# Patient Record
Sex: Female | Born: 1955
Health system: Southern US, Community
[De-identification: ages and names within clinical notes are randomized; demographics above are authoritative.]

## PROBLEM LIST (undated history)

## (undated) DIAGNOSIS — M545 Low back pain, unspecified: Secondary | ICD-10-CM

## (undated) DIAGNOSIS — M5136 Other intervertebral disc degeneration, lumbar region: Secondary | ICD-10-CM

## (undated) DIAGNOSIS — K219 Gastro-esophageal reflux disease without esophagitis: Secondary | ICD-10-CM

## (undated) DIAGNOSIS — G9519 Other vascular myelopathies: Secondary | ICD-10-CM

## (undated) DIAGNOSIS — M51369 Other intervertebral disc degeneration, lumbar region without mention of lumbar back pain or lower extremity pain: Secondary | ICD-10-CM

## (undated) DIAGNOSIS — M25511 Pain in right shoulder: Secondary | ICD-10-CM

## (undated) DIAGNOSIS — F32A Depression, unspecified: Secondary | ICD-10-CM

## (undated) DIAGNOSIS — M4180 Other forms of scoliosis, site unspecified: Secondary | ICD-10-CM

## (undated) DIAGNOSIS — M4800 Spinal stenosis, site unspecified: Secondary | ICD-10-CM

## (undated) DIAGNOSIS — M12811 Other specific arthropathies, not elsewhere classified, right shoulder: Secondary | ICD-10-CM

## (undated) DIAGNOSIS — F419 Anxiety disorder, unspecified: Secondary | ICD-10-CM

## (undated) DIAGNOSIS — M48062 Spinal stenosis, lumbar region with neurogenic claudication: Secondary | ICD-10-CM

## (undated) DIAGNOSIS — M47816 Spondylosis without myelopathy or radiculopathy, lumbar region: Secondary | ICD-10-CM

## (undated) DIAGNOSIS — I10 Essential (primary) hypertension: Secondary | ICD-10-CM

## (undated) DIAGNOSIS — F329 Major depressive disorder, single episode, unspecified: Secondary | ICD-10-CM

## (undated) DIAGNOSIS — M25552 Pain in left hip: Secondary | ICD-10-CM

## (undated) DIAGNOSIS — M5416 Radiculopathy, lumbar region: Secondary | ICD-10-CM

## (undated) DIAGNOSIS — M4150 Other secondary scoliosis, site unspecified: Secondary | ICD-10-CM

## (undated) DIAGNOSIS — R29818 Other symptoms and signs involving the nervous system: Secondary | ICD-10-CM

## (undated) HISTORY — DX: Spinal stenosis, site unspecified: M48.00

## (undated) HISTORY — DX: Other forms of scoliosis, site unspecified: M41.80

## (undated) HISTORY — PX: BACK SURGERY: SHX140

## (undated) HISTORY — DX: Low back pain, unspecified: M54.50

## (undated) HISTORY — DX: Radiculopathy, lumbar region: M54.16

## (undated) HISTORY — DX: Other symptoms and signs involving the nervous system: R29.818

## (undated) HISTORY — DX: Spondylosis without myelopathy or radiculopathy, lumbar region: M47.816

## (undated) HISTORY — DX: Other intervertebral disc degeneration, lumbar region: M51.36

## (undated) HISTORY — DX: Pain in right shoulder: M25.511

## (undated) HISTORY — DX: Pain in left hip: M25.552

## (undated) HISTORY — PX: TUBAL LIGATION: SHX77

## (undated) HISTORY — DX: Other secondary scoliosis, site unspecified: M41.50

## (undated) HISTORY — DX: Other vascular myelopathies: G95.19

## (undated) HISTORY — DX: Other intervertebral disc degeneration, lumbar region without mention of lumbar back pain or lower extremity pain: M51.369

## (undated) HISTORY — DX: Other specific arthropathies, not elsewhere classified, right shoulder: M12.811

## (undated) HISTORY — PX: SHOULDER SURGERY: SHX246

---
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
## 2000-12-11 ENCOUNTER — Emergency Department (HOSPITAL_COMMUNITY): Admission: EM | Admit: 2000-12-11 | Discharge: 2000-12-11 | Payer: Self-pay | Admitting: Emergency Medicine

## 2001-05-02 ENCOUNTER — Encounter: Payer: Self-pay | Admitting: Internal Medicine

## 2001-05-02 ENCOUNTER — Ambulatory Visit (HOSPITAL_COMMUNITY): Admission: RE | Admit: 2001-05-02 | Discharge: 2001-05-02 | Payer: Self-pay | Admitting: Internal Medicine

## 2001-05-16 ENCOUNTER — Ambulatory Visit (HOSPITAL_COMMUNITY): Admission: RE | Admit: 2001-05-16 | Discharge: 2001-05-16 | Payer: Self-pay | Admitting: Internal Medicine

## 2001-05-16 ENCOUNTER — Encounter: Payer: Self-pay | Admitting: Internal Medicine

## 2013-12-21 DIAGNOSIS — K219 Gastro-esophageal reflux disease without esophagitis: Secondary | ICD-10-CM | POA: Insufficient documentation

## 2014-02-01 DIAGNOSIS — M159 Polyosteoarthritis, unspecified: Secondary | ICD-10-CM | POA: Insufficient documentation

## 2014-03-01 DIAGNOSIS — I10 Essential (primary) hypertension: Secondary | ICD-10-CM | POA: Insufficient documentation

## 2015-03-24 DIAGNOSIS — Z0000 Encounter for general adult medical examination without abnormal findings: Secondary | ICD-10-CM | POA: Insufficient documentation

## 2015-05-04 HISTORY — PX: CARPAL TUNNEL RELEASE: SHX101

## 2015-07-24 DIAGNOSIS — M75121 Complete rotator cuff tear or rupture of right shoulder, not specified as traumatic: Secondary | ICD-10-CM | POA: Insufficient documentation

## 2015-08-14 DIAGNOSIS — D649 Anemia, unspecified: Secondary | ICD-10-CM | POA: Insufficient documentation

## 2015-11-03 DIAGNOSIS — K2270 Barrett's esophagus without dysplasia: Secondary | ICD-10-CM | POA: Insufficient documentation

## 2016-09-06 DIAGNOSIS — M5136 Other intervertebral disc degeneration, lumbar region: Secondary | ICD-10-CM | POA: Insufficient documentation

## 2016-09-28 DIAGNOSIS — M5416 Radiculopathy, lumbar region: Secondary | ICD-10-CM | POA: Insufficient documentation

## 2017-11-09 DIAGNOSIS — E559 Vitamin D deficiency, unspecified: Secondary | ICD-10-CM | POA: Insufficient documentation

## 2017-11-09 DIAGNOSIS — F418 Other specified anxiety disorders: Secondary | ICD-10-CM | POA: Insufficient documentation

## 2018-06-08 DIAGNOSIS — G9519 Other vascular myelopathies: Secondary | ICD-10-CM | POA: Insufficient documentation

## 2018-06-08 DIAGNOSIS — M47816 Spondylosis without myelopathy or radiculopathy, lumbar region: Secondary | ICD-10-CM | POA: Insufficient documentation

## 2018-07-06 DIAGNOSIS — M4800 Spinal stenosis, site unspecified: Secondary | ICD-10-CM | POA: Insufficient documentation

## 2019-01-08 ENCOUNTER — Emergency Department (HOSPITAL_COMMUNITY)
Admission: EM | Admit: 2019-01-08 | Discharge: 2019-01-08 | Disposition: A | Discharge status: Home / Self Care | Payer: Medicare Other | Attending: Emergency Medicine | Admitting: Emergency Medicine

## 2019-01-08 ENCOUNTER — Other Ambulatory Visit: Payer: Self-pay

## 2019-01-08 ENCOUNTER — Emergency Department (HOSPITAL_COMMUNITY): Payer: Medicare Other

## 2019-01-08 ENCOUNTER — Encounter (HOSPITAL_COMMUNITY): Payer: Self-pay

## 2019-01-08 DIAGNOSIS — M25511 Pain in right shoulder: Secondary | ICD-10-CM | POA: Diagnosis not present

## 2019-01-08 MED ORDER — HYDROCODONE-ACETAMINOPHEN 5-325 MG PO TABS: ORAL_TABLET | ORAL | 0 refills | Status: DC

## 2019-01-08 NOTE — ED Notes (Signed)
ED Provider at bedside. 

## 2019-01-08 NOTE — Discharge Instructions (Addendum)
Follow-up with Dr. Aline Brochure in [place] either this week or next week.  Or you can follow-up with Dr. Ninfa Linden in the next couple weeks in [place].  Both physicians are orthopedic specialist

## 2019-01-08 NOTE — ED Triage Notes (Signed)
Pt c/o r shoulder pain x 1 week.  Reports pain started after moving furniture.

## 2019-01-08 NOTE — ED Provider Notes (Signed)
[facility] EMERGENCY DEPARTMENT Provider Note   CSN: [id number] Arrival date & time: 01/08/19  9326     History   Chief Complaint Chief Complaint  Patient presents with  . Shoulder Pain    HPI Terri Alvarado is a 63 y.o. female.      Patient states that she fell recently and has pain in her right shoulder.  The history is provided by the patient. No language interpreter was used.  Shoulder Pain Location:  Shoulder Shoulder location:  R shoulder Injury: yes   Pain details:    Quality:  Aching   Radiates to:  Does not radiate   Severity:  Moderate   Onset quality:  Sudden   Timing:  Constant   Progression:  Worsening Associated symptoms: no back pain and no fatigue     History reviewed. No pertinent past medical history.  There are no active problems to display for this patient.   Past Surgical History:  Procedure Laterality Date  . BACK SURGERY    . SHOULDER SURGERY       OB History   No obstetric history on file.      Home Medications    Prior to Admission medications   Medication Sig Start Date End Date Taking? Authorizing Provider  FLUoxetine (PROZAC) 40 MG capsule Take 1 capsule by mouth daily. 12/20/18  Yes [provider]  gabapentin (NEURONTIN) 300 MG capsule Take 1 capsule by mouth 3 (three) times daily.   Yes [provider]  lisinopril-hydrochlorothiazide (ZESTORETIC) 20-12.5 MG tablet Take 1 tablet by mouth daily. 10/10/18  Yes [provider]  omeprazole (PRILOSEC) 40 MG capsule Take 1 capsule by mouth daily. 11/23/18  Yes [provider]  tiZANidine (ZANAFLEX) 4 MG tablet Take 1 tablet by mouth daily. 11/23/18 11/23/19 Yes [provider]  HYDROcodone-acetaminophen (NORCO/VICODIN) 5-325 MG tablet Take 1 every 6-8 hours for pain not relieved by Tylenol alone 01/08/19   Milton Ferguson, MD    Family History No family history on file.  Social History Social History   Tobacco Use  . Smoking status:  Never Smoker  . Smokeless tobacco: Never Used  Substance Use Topics  . Alcohol use: Never    Frequency: Never  . Drug use: Never     Allergies   Patient has no known allergies.   Review of Systems Review of Systems  Constitutional: Negative for appetite change and fatigue.  HENT: Negative for congestion, ear discharge and sinus pressure.   Eyes: Negative for discharge.  Respiratory: Negative for cough.   Cardiovascular: Negative for chest pain.  Gastrointestinal: Negative for abdominal pain and diarrhea.  Genitourinary: Negative for frequency and hematuria.  Musculoskeletal: Negative for back pain.       Pain in right shoulder  Skin: Negative for rash.  Neurological: Negative for seizures and headaches.  Psychiatric/Behavioral: Negative for hallucinations.     Physical Exam Updated Vital Signs BP 119/81   Pulse 66   Temp 97.8 F (36.6 C)   Resp 18   Ht 5\' 3"  (1.6 m)   Wt 68 kg   SpO2 100%   BMI 26.57 kg/m   Physical Exam Vitals signs and nursing note reviewed.  Constitutional:      Appearance: She is well-developed.  HENT:     Head: Normocephalic.  Eyes:     Conjunctiva/sclera: Conjunctivae normal.  Neck:     Musculoskeletal: Normal range of motion.     Trachea: No tracheal deviation.  Cardiovascular:  Rate and Rhythm: Normal rate.     Heart sounds: No murmur.  Musculoskeletal:     Comments: Decreased range of motion with pain on right  shoulder  Skin:    General: Skin is warm.  Neurological:     Mental Status: She is alert and oriented to person, place, and time.      ED Treatments / Results  Labs (all labs ordered are listed, but only abnormal results are displayed) Labs Reviewed - No data to display  EKG None  Radiology Dg Shoulder Right  Result Date: 01/08/2019 CLINICAL DATA:  Pain which began after moving furniture. EXAM: RIGHT SHOULDER - 2+ VIEW COMPARISON:  None. FINDINGS: No acute fracture or dislocation identified. There is  advanced degenerative changes involving the acromioclavicular joint. Narrowing of the acromial humeral interval noted which may reflect rotator cuff tear. IMPRESSION: 1. AC joint osteoarthritis. 2. Narrowing the acromial humeral interval which may reflect a rotator cuff injury. Electronically Signed   By: Signa Kellaylor  Stroud M.D.   On: 01/08/2019 10:48    Procedures Procedures (including critical care time)  Medications Ordered in ED Medications - No data to display   Initial Impression / Assessment and Plan / ED Course  I have reviewed the triage vital signs and the nursing notes.  Pertinent labs & imaging results that were available during my care of the patient were reviewed by me and considered in my medical decision making (see chart for details).    X-ray shows arthritis and possible rotator cuff injury.  Patient given Vicodin for discomfort and referred to orthopedics     Final Clinical Impressions(s) / ED Diagnoses   Final diagnoses:  Acute pain of right shoulder    ED Discharge Orders         Ordered    HYDROcodone-acetaminophen (NORCO/VICODIN) 5-325 MG tablet     01/08/19 1223           Bethann BerkshireZammit, Gennavieve Huq, MD 01/08/19 1230

## 2019-01-19 DIAGNOSIS — M25511 Pain in right shoulder: Secondary | ICD-10-CM | POA: Insufficient documentation

## 2019-01-25 ENCOUNTER — Other Ambulatory Visit (HOSPITAL_COMMUNITY): Payer: Self-pay | Admitting: Family Medicine

## 2019-01-25 ENCOUNTER — Other Ambulatory Visit: Payer: Self-pay | Admitting: Family Medicine

## 2019-01-25 DIAGNOSIS — Z13820 Encounter for screening for osteoporosis: Secondary | ICD-10-CM

## 2019-01-25 DIAGNOSIS — M545 Low back pain, unspecified: Secondary | ICD-10-CM

## 2019-01-30 ENCOUNTER — Ambulatory Visit (HOSPITAL_COMMUNITY)
Admission: RE | Admit: 2019-01-30 | Discharge: 2019-01-30 | Disposition: A | Discharge status: Home / Self Care | Payer: Medicare Other | Source: Ambulatory Visit | Attending: Family Medicine | Admitting: Family Medicine

## 2019-01-30 ENCOUNTER — Other Ambulatory Visit: Payer: Self-pay

## 2019-01-30 DIAGNOSIS — M545 Low back pain, unspecified: Secondary | ICD-10-CM

## 2019-01-31 ENCOUNTER — Other Ambulatory Visit (HOSPITAL_COMMUNITY): Payer: Self-pay | Admitting: Internal Medicine

## 2019-01-31 ENCOUNTER — Other Ambulatory Visit: Payer: Self-pay | Admitting: Internal Medicine

## 2019-02-01 HISTORY — PX: BACK SURGERY: SHX140

## 2019-04-12 DIAGNOSIS — M25552 Pain in left hip: Secondary | ICD-10-CM | POA: Insufficient documentation

## 2019-04-26 DIAGNOSIS — M12811 Other specific arthropathies, not elsewhere classified, right shoulder: Secondary | ICD-10-CM | POA: Insufficient documentation

## 2019-05-21 ENCOUNTER — Other Ambulatory Visit (HOSPITAL_COMMUNITY): Payer: Medicare Other

## 2019-05-21 ENCOUNTER — Other Ambulatory Visit (HOSPITAL_COMMUNITY): Payer: Self-pay | Admitting: *Deleted

## 2019-05-21 NOTE — Patient Instructions (Addendum)
DUE TO COVID-19 ONLY ONE VISITOR IS ALLOWED TO COME WITH YOU AND STAY IN THE WAITING ROOM ONLY DURING PRE OP AND PROCEDURE DAY OF SURGERY. THE 1 VISITOR MAY VISIT WITH YOU AFTER SURGERY IN YOUR PRIVATE ROOM DURING VISITING HOURS ONLY!  YOU NEED TO HAVE A COVID 19 TEST ON__    Tuesday 01/19/2021_____ @__  10:15 am_____, THIS TEST MUST BE DONE BEFORE SURGERY, COME  [address], [place] , .  [facility]) ONCE YOUR COVID TEST IS COMPLETED, PLEASE BEGIN THE QUARANTINE INSTRUCTIONS AS OUTLINED IN YOUR HANDOUT.                Terri Alvarado     Your procedure is scheduled on: Thursday 05/24/2019   Report to [facility] Main  Entrance    Report to admitting at  1000 AM     Call this number if you have problems the morning of surgery [phone]    Remember: Do not eat food  :After Midnight.     NO SOLID FOOD AFTER MIDNIGHT THE NIGHT PRIOR TO SURGERY and  NOTHING BY MOUTH EXCEPT CLEAR LIQUIDS UNTIL 0930 am .     PLEASE FINISH ENSURE DRINK PER SURGEON ORDER  WHICH NEEDS TO BE COMPLETED AT   0930 am.   CLEAR LIQUID DIET   Foods Allowed                                                                     Foods Excluded  Coffee and tea, regular and decaf                             liquids that you cannot  Plain Jell-O any favor except red or purple                                           see through such as: Fruit ices (not with fruit pulp)                                     milk, soups, orange juice  Iced Popsicles                                    All solid food Carbonated beverages, regular and diet                                    Cranberry, grape and apple juices Sports drinks like Gatorade Lightly seasoned clear broth or consume(fat free) Sugar, honey syrup  Sample Menu Breakfast                                Lunch  Supper Cranberry juice                    Beef broth                             Chicken broth Jell-O                                     Grape juice                           Apple juice Coffee or tea                        Jell-O                                      Popsicle                                                Coffee or tea                        Coffee or tea  _____________________________________________________________________    BRUSH YOUR TEETH MORNING OF SURGERY AND RINSE YOUR MOUTH OUT, NO CHEWING GUM CANDY OR MINTS.     Take these medicines the morning of surgery with A SIP OF WATER: Gabapentin (Neurontin), Duloxetine (Cymbalta), Omeprazole (Prilosec)                                 You may not have any metal on your body including hair pins and              piercings  Do not wear jewelry, make-up, lotions, powders or perfumes, deodorant             Do not wear nail polish on your fingernails.  Do not shave  48 hours prior to surgery.                Do not bring valuables to the hospital. [place].  Contacts, dentures or bridgework may not be worn into surgery.  Leave suitcase in the car. After surgery it may be brought to your room.     Patients discharged the day of surgery will not be allowed to drive home. IF YOU ARE HAVING SURGERY AND GOING HOME THE SAME DAY, YOU MUST HAVE AN ADULT TO DRIVE YOU HOME AND  BE WITH YOU FOR 24 HOURS. YOU MAY GO HOME BY TAXI OR UBER OR ORTHERWISE, BUT AN ADULT MUST ACCOMPANY YOU HOME AND STAY WITH YOU FOR 24 HOURS.  Name and phone number of your driver:spuse- Francee Piccolo  [phone]                Please read over the following fact sheets you were given: _____________________________________________________________________             [facility] - Preparing for Surgery Before  surgery, you can play an important role.  Because skin is not sterile, your skin needs to be as free of germs as possible.  You can reduce the number of germs on your skin by  washing with CHG (chlorahexidine gluconate) soap before surgery.  CHG is an antiseptic cleaner which kills germs and bonds with the skin to continue killing germs even after washing. Please DO NOT use if you have an allergy to CHG or antibacterial soaps.  If your skin becomes reddened/irritated stop using the CHG and inform your nurse when you arrive at Short Stay. Do not shave (including legs and underarms) for at least 48 hours prior to the first CHG shower.  You may shave your face/neck. Please follow these instructions carefully:  1.  Shower with CHG Soap the night before surgery and the  morning of Surgery.  2.  If you choose to wash your hair, wash your hair first as usual with your  normal  shampoo.  3.  After you shampoo, rinse your hair and body thoroughly to remove the  shampoo.                           4.  Use CHG as you would any other liquid soap.  You can apply chg directly  to the skin and wash                       Gently with a scrungie or clean washcloth.  5.  Apply the CHG Soap to your body ONLY FROM THE NECK DOWN.   Do not use on face/ open                           Wound or open sores. Avoid contact with eyes, ears mouth and genitals (private parts).                       Wash face,  Genitals (private parts) with your normal soap.             6.  Wash thoroughly, paying special attention to the area where your surgery  will be performed.  7.  Thoroughly rinse your body with warm water from the neck down.  8.  DO NOT shower/wash with your normal soap after using and rinsing off  the CHG Soap.                9.  Pat yourself dry with a clean towel.            10.  Wear clean pajamas.            11.  Place clean sheets on your bed the night of your first shower and do not  sleep with pets. Day of Surgery : Do not apply any lotions/deodorants the morning of surgery.  Please wear clean clothes to the hospital/surgery center.  FAILURE TO FOLLOW THESE INSTRUCTIONS MAY RESULT IN THE  CANCELLATION OF YOUR SURGERY PATIENT SIGNATURE_________________________________  NURSE SIGNATURE__________________________________  ________________________________________________________________________   Terri Alvarado  An incentive spirometer is a tool that can help keep your lungs clear and active. This tool measures how well you are filling your lungs with each breath. Taking long deep breaths may help reverse or decrease the chance of developing breathing (pulmonary) problems (especially infection) following:  A long period of time when you are unable to move  or be active. BEFORE THE PROCEDURE   If the spirometer includes an indicator to show your best effort, your nurse or respiratory therapist will set it to a desired goal.  If possible, sit up straight or lean slightly forward. Try not to slouch.  Hold the incentive spirometer in an upright position. INSTRUCTIONS FOR USE  1. Sit on the edge of your bed if possible, or sit up as far as you can in bed or on a chair. 2. Hold the incentive spirometer in an upright position. 3. Breathe out normally. 4. Place the mouthpiece in your mouth and seal your lips tightly around it. 5. Breathe in slowly and as deeply as possible, raising the piston or the ball toward the top of the column. 6. Hold your breath for 3-5 seconds or for as long as possible. Allow the piston or ball to fall to the bottom of the column. 7. Remove the mouthpiece from your mouth and breathe out normally. 8. Rest for a few seconds and repeat Steps 1 through 7 at least 10 times every 1-2 hours when you are awake. Take your time and take a few normal breaths between deep breaths. 9. The spirometer may include an indicator to show your best effort. Use the indicator as a goal to work toward during each repetition. 10. After each set of 10 deep breaths, practice coughing to be sure your lungs are clear. If you have an incision (the cut made at the time of surgery),  support your incision when coughing by placing a pillow or rolled up towels firmly against it. Once you are able to get out of bed, walk around indoors and cough well. You may stop using the incentive spirometer when instructed by your caregiver.  RISKS AND COMPLICATIONS  Take your time so you do not get dizzy or light-headed.  If you are in pain, you may need to take or ask for pain medication before doing incentive spirometry. It is harder to take a deep breath if you are having pain. AFTER USE  Rest and breathe slowly and easily.  It can be helpful to keep track of a log of your progress. Your caregiver can provide you with a simple table to help with this. If you are using the spirometer at home, follow these instructions: [place] IF:   You are having difficultly using the spirometer.  You have trouble using the spirometer as often as instructed.  Your pain medication is not giving enough relief while using the spirometer.  You develop fever of 100.5 F (38.1 C) or higher. SEEK IMMEDIATE MEDICAL CARE IF:   You cough up bloody sputum that had not been present before.  You develop fever of 102 F (38.9 C) or greater.  You develop worsening pain at or near the incision site. MAKE SURE YOU:   Understand these instructions.  Will watch your condition.  Will get help right away if you are not doing well or get worse. Document Released: 08/30/2006 Document Revised: 07/12/2011 Document Reviewed: 10/31/2006 [facility] Patient Information 2014 [place], [place].   ________________________________________________________________________

## 2019-05-22 ENCOUNTER — Encounter (HOSPITAL_COMMUNITY): Payer: Self-pay

## 2019-05-22 ENCOUNTER — Encounter (HOSPITAL_COMMUNITY)
Admission: RE | Admit: 2019-05-22 | Discharge: 2019-05-22 | Disposition: A | Discharge status: Home / Self Care | Payer: Medicare Other | Source: Ambulatory Visit | Attending: Orthopedic Surgery | Admitting: Orthopedic Surgery

## 2019-05-22 ENCOUNTER — Other Ambulatory Visit: Payer: Self-pay

## 2019-05-22 ENCOUNTER — Other Ambulatory Visit (HOSPITAL_COMMUNITY)
Admission: RE | Admit: 2019-05-22 | Discharge: 2019-05-22 | Disposition: A | Discharge status: Home / Self Care | Payer: Medicare Other | Source: Ambulatory Visit | Attending: Orthopedic Surgery | Admitting: Orthopedic Surgery

## 2019-05-22 DIAGNOSIS — R9431 Abnormal electrocardiogram [ECG] [EKG]: Secondary | ICD-10-CM | POA: Insufficient documentation

## 2019-05-22 DIAGNOSIS — Z20822 Contact with and (suspected) exposure to covid-19: Secondary | ICD-10-CM | POA: Insufficient documentation

## 2019-05-22 DIAGNOSIS — Z01818 Encounter for other preprocedural examination: Secondary | ICD-10-CM | POA: Diagnosis present

## 2019-05-22 DIAGNOSIS — I10 Essential (primary) hypertension: Secondary | ICD-10-CM | POA: Insufficient documentation

## 2019-05-22 HISTORY — DX: Anxiety disorder, unspecified: F41.9

## 2019-05-22 HISTORY — DX: Depression, unspecified: F32.A

## 2019-05-22 HISTORY — DX: Gastro-esophageal reflux disease without esophagitis: K21.9

## 2019-05-22 HISTORY — DX: Essential (primary) hypertension: I10

## 2019-05-22 LAB — BASIC METABOLIC PANEL
Anion gap: 4 — ABNORMAL LOW (ref 5–15)
BUN: 19 mg/dL (ref 8–23)
CO2: 28 mmol/L (ref 22–32)
Calcium: 8.9 mg/dL (ref 8.9–10.3)
Chloride: 104 mmol/L (ref 98–111)
Creatinine, Ser: 0.72 mg/dL (ref 0.44–1.00)
GFR calc Af Amer: >60 mL/min (ref >60)
GFR calc non Af Amer: >60 mL/min (ref >60)
Glucose, Bld: 107 mg/dL — ABNORMAL HIGH (ref 70–99)
Potassium: 4.6 mmol/L (ref 3.5–5.1)
Sodium: 136 mmol/L (ref 135–145)

## 2019-05-22 LAB — CBC
HCT: 34.2 % — ABNORMAL LOW (ref 36.0–46.0)
Hemoglobin: 9.9 g/dL — ABNORMAL LOW (ref 12.0–15.0)
MCH: 22.9 pg — ABNORMAL LOW (ref 26.0–34.0)
MCHC: 28.9 g/dL — ABNORMAL LOW (ref 30.0–36.0)
MCV: 79 fL — ABNORMAL LOW (ref 80.0–100.0)
Platelets: 317 10*3/uL (ref 150–400)
RBC: 4.33 MIL/uL (ref 3.87–5.11)
RDW: 16.6 % — ABNORMAL HIGH (ref 11.5–15.5)
WBC: 5.9 10*3/uL (ref 4.0–10.5)
nRBC: 0 % (ref 0.0–0.2)

## 2019-05-22 LAB — SURGICAL PCR SCREEN
MRSA, PCR: NEGATIVE
Staphylococcus aureus: NEGATIVE

## 2019-05-22 LAB — SARS CORONAVIRUS 2 (TAT 6-24 HRS): SARS Coronavirus 2: NEGATIVE

## 2019-05-22 NOTE — Progress Notes (Signed)
PCP - Dr. Marla Roe, [place] Cardiologist - n/a  Chest x-ray - n/a EKG - 05/22/2019  EPIC Stress Test - n/a ECHO - n/a Cardiac Cath - n/a  Sleep Study - n/a CPAP - n/a  Fasting Blood Sugar - n/a Checks Blood Sugar __0___ times a day  Blood Thinner Instructions:n/a Aspirin Instructions:n/a Last Dose:n/a  Anesthesia review:    Patient has a history of HTN.  Patient denies shortness of breath, fever, cough and chest pain at PAT appointment   Patient verbalized understanding of instructions that were given to them at the PAT appointment. Patient was also instructed that they will need to review over the PAT instructions again at home before surgery.

## 2019-05-22 NOTE — Progress Notes (Signed)
LM on VM for patient to call back to go over medical history and get instructions for surgery.

## 2019-05-24 ENCOUNTER — Ambulatory Visit (HOSPITAL_COMMUNITY): Payer: Medicare Other | Admitting: Physician Assistant

## 2019-05-24 ENCOUNTER — Encounter (HOSPITAL_COMMUNITY): Payer: Self-pay | Admitting: Orthopedic Surgery

## 2019-05-24 ENCOUNTER — Ambulatory Visit (HOSPITAL_COMMUNITY)
Admission: RE | Admit: 2019-05-24 | Discharge: 2019-05-24 | Disposition: A | Discharge status: Home / Self Care | Payer: Medicare Other | Attending: Orthopedic Surgery | Admitting: Orthopedic Surgery

## 2019-05-24 ENCOUNTER — Encounter (HOSPITAL_COMMUNITY)
Admission: RE | Disposition: A | Discharge status: Home / Self Care | Payer: Self-pay | Source: Home / Self Care | Attending: Orthopedic Surgery

## 2019-05-24 DIAGNOSIS — K219 Gastro-esophageal reflux disease without esophagitis: Secondary | ICD-10-CM | POA: Diagnosis not present

## 2019-05-24 DIAGNOSIS — F329 Major depressive disorder, single episode, unspecified: Secondary | ICD-10-CM | POA: Insufficient documentation

## 2019-05-24 DIAGNOSIS — F419 Anxiety disorder, unspecified: Secondary | ICD-10-CM | POA: Diagnosis not present

## 2019-05-24 DIAGNOSIS — I10 Essential (primary) hypertension: Secondary | ICD-10-CM | POA: Insufficient documentation

## 2019-05-24 DIAGNOSIS — M75101 Unspecified rotator cuff tear or rupture of right shoulder, not specified as traumatic: Secondary | ICD-10-CM | POA: Insufficient documentation

## 2019-05-24 DIAGNOSIS — M12811 Other specific arthropathies, not elsewhere classified, right shoulder: Secondary | ICD-10-CM | POA: Diagnosis not present

## 2019-05-24 DIAGNOSIS — Z79899 Other long term (current) drug therapy: Secondary | ICD-10-CM | POA: Diagnosis not present

## 2019-05-24 DIAGNOSIS — Z96611 Presence of right artificial shoulder joint: Secondary | ICD-10-CM

## 2019-05-24 DIAGNOSIS — Z87891 Personal history of nicotine dependence: Secondary | ICD-10-CM | POA: Insufficient documentation

## 2019-05-24 HISTORY — PX: REVERSE SHOULDER ARTHROPLASTY: SHX5054

## 2019-05-24 SURGERY — ARTHROPLASTY, SHOULDER, TOTAL, REVERSE
Anesthesia: Regional | Site: Shoulder | Laterality: Right

## 2019-05-24 MED ORDER — ONDANSETRON HCL 4 MG/2ML IJ SOLN
INTRAMUSCULAR | Status: AC
  Filled 2019-05-24: qty 2

## 2019-05-24 MED ORDER — PROPOFOL 10 MG/ML IV BOLUS
INTRAVENOUS | Status: AC
  Filled 2019-05-24: qty 20

## 2019-05-24 MED ORDER — OXYCODONE-ACETAMINOPHEN 5-325 MG PO TABS: 1.0000 | ORAL_TABLET | ORAL | 0 refills | Status: DC | PRN

## 2019-05-24 MED ORDER — ONDANSETRON HCL 4 MG PO TABS: 4.0000 mg | ORAL_TABLET | Freq: Three times a day (TID) | ORAL | 0 refills | Status: DC | PRN

## 2019-05-24 MED ORDER — ROCURONIUM BROMIDE 10 MG/ML (PF) SYRINGE
PREFILLED_SYRINGE | INTRAVENOUS | Status: DC | PRN
  Administered 2019-05-24: 50 mg via INTRAVENOUS

## 2019-05-24 MED ORDER — ROCURONIUM BROMIDE 10 MG/ML (PF) SYRINGE
PREFILLED_SYRINGE | INTRAVENOUS | Status: AC
  Filled 2019-05-24: qty 10

## 2019-05-24 MED ORDER — FENTANYL CITRATE (PF) 100 MCG/2ML IJ SOLN
INTRAMUSCULAR | Status: AC
  Filled 2019-05-24: qty 2

## 2019-05-24 MED ORDER — MIDAZOLAM HCL 2 MG/2ML IJ SOLN
1.0000 mg | Freq: Once | INTRAMUSCULAR | Status: AC
  Administered 2019-05-24: 2 mg via INTRAVENOUS
  Filled 2019-05-24: qty 2

## 2019-05-24 MED ORDER — PHENYLEPHRINE 40 MCG/ML (10ML) SYRINGE FOR IV PUSH (FOR BLOOD PRESSURE SUPPORT)
PREFILLED_SYRINGE | INTRAVENOUS | Status: DC | PRN
  Administered 2019-05-24 (×2): 160 ug via INTRAVENOUS

## 2019-05-24 MED ORDER — CEFAZOLIN SODIUM-DEXTROSE 2-4 GM/100ML-% IV SOLN
2.0000 g | INTRAVENOUS | Status: AC
  Administered 2019-05-24: 2 g via INTRAVENOUS
  Filled 2019-05-24: qty 100

## 2019-05-24 MED ORDER — NAPROXEN 500 MG PO TABS: 500.0000 mg | ORAL_TABLET | Freq: Two times a day (BID) | ORAL | 1 refills | Status: DC

## 2019-05-24 MED ORDER — CHLORHEXIDINE GLUCONATE 4 % EX LIQD: 60.0000 mL | Freq: Once | CUTANEOUS | Status: DC

## 2019-05-24 MED ORDER — PROPOFOL 10 MG/ML IV BOLUS
INTRAVENOUS | Status: DC | PRN
  Administered 2019-05-24: 150 mg via INTRAVENOUS

## 2019-05-24 MED ORDER — ONDANSETRON HCL 4 MG/2ML IJ SOLN
INTRAMUSCULAR | Status: DC | PRN
  Administered 2019-05-24: 4 mg via INTRAVENOUS

## 2019-05-24 MED ORDER — KETOROLAC TROMETHAMINE 15 MG/ML IJ SOLN: 15.0000 mg | Freq: Once | INTRAMUSCULAR | Status: DC | PRN

## 2019-05-24 MED ORDER — ONDANSETRON HCL 4 MG/2ML IJ SOLN: 4.0000 mg | Freq: Once | INTRAMUSCULAR | Status: DC | PRN

## 2019-05-24 MED ORDER — BUPIVACAINE LIPOSOME 1.3 % IJ SUSP
INTRAMUSCULAR | Status: DC | PRN
  Administered 2019-05-24: 10 mL via PERINEURAL

## 2019-05-24 MED ORDER — FENTANYL CITRATE (PF) 100 MCG/2ML IJ SOLN
50.0000 ug | Freq: Once | INTRAMUSCULAR | Status: DC
  Filled 2019-05-24: qty 2

## 2019-05-24 MED ORDER — DEXAMETHASONE SODIUM PHOSPHATE 10 MG/ML IJ SOLN
INTRAMUSCULAR | Status: AC
  Filled 2019-05-24: qty 1

## 2019-05-24 MED ORDER — LIDOCAINE 2% (20 MG/ML) 5 ML SYRINGE
INTRAMUSCULAR | Status: DC | PRN
  Administered 2019-05-24: 40 mg via INTRAVENOUS

## 2019-05-24 MED ORDER — PHENYLEPHRINE HCL (PRESSORS) 10 MG/ML IV SOLN
INTRAVENOUS | Status: AC
  Filled 2019-05-24: qty 1

## 2019-05-24 MED ORDER — SUGAMMADEX SODIUM 200 MG/2ML IV SOLN
INTRAVENOUS | Status: DC | PRN
  Administered 2019-05-24: 150 mg via INTRAVENOUS

## 2019-05-24 MED ORDER — CYCLOBENZAPRINE HCL 10 MG PO TABS: 10.0000 mg | ORAL_TABLET | Freq: Three times a day (TID) | ORAL | 1 refills | Status: DC | PRN

## 2019-05-24 MED ORDER — FENTANYL CITRATE (PF) 100 MCG/2ML IJ SOLN: 25.0000 ug | INTRAMUSCULAR | Status: DC | PRN

## 2019-05-24 MED ORDER — LACTATED RINGERS IV SOLN: INTRAVENOUS | Status: DC

## 2019-05-24 MED ORDER — TRANEXAMIC ACID-NACL 1000-0.7 MG/100ML-% IV SOLN
1000.0000 mg | INTRAVENOUS | Status: AC
  Administered 2019-05-24: 1000 mg via INTRAVENOUS
  Filled 2019-05-24: qty 100

## 2019-05-24 MED ORDER — FENTANYL CITRATE (PF) 100 MCG/2ML IJ SOLN
INTRAMUSCULAR | Status: DC | PRN
  Administered 2019-05-24: 50 ug via INTRAVENOUS

## 2019-05-24 MED ORDER — LIDOCAINE 2% (20 MG/ML) 5 ML SYRINGE
INTRAMUSCULAR | Status: AC
  Filled 2019-05-24: qty 5

## 2019-05-24 MED ORDER — BUPIVACAINE HCL (PF) 0.5 % IJ SOLN
INTRAMUSCULAR | Status: DC | PRN
  Administered 2019-05-24: 15 mL via PERINEURAL

## 2019-05-24 MED ORDER — DEXAMETHASONE SODIUM PHOSPHATE 10 MG/ML IJ SOLN
INTRAMUSCULAR | Status: DC | PRN
  Administered 2019-05-24: 8 mg via INTRAVENOUS

## 2019-05-24 MED ORDER — PHENYLEPHRINE HCL-NACL 10-0.9 MG/250ML-% IV SOLN
INTRAVENOUS | Status: DC | PRN
  Administered 2019-05-24: 30 ug/min via INTRAVENOUS

## 2019-05-24 MED ORDER — ACETAMINOPHEN 500 MG PO TABS
1000.0000 mg | ORAL_TABLET | Freq: Once | ORAL | Status: AC
  Administered 2019-05-24: 1000 mg via ORAL
  Filled 2019-05-24: qty 2

## 2019-05-24 MED ORDER — SODIUM CHLORIDE 0.9 % IR SOLN
Status: DC | PRN
  Administered 2019-05-24: 2000 mL
  Administered 2019-05-24: 1000 mL

## 2019-05-24 SURGICAL SUPPLY — 66 items
BAG ZIPLOCK 12X15 (MISCELLANEOUS) ×3 IMPLANT
BLADE SAW SGTL 83.5X18.5 (BLADE) ×3 IMPLANT
COOLER ICEMAN CLASSIC (MISCELLANEOUS) ×3 IMPLANT
COVER BACK TABLE 60X90IN (DRAPES) ×3 IMPLANT
COVER SURGICAL LIGHT HANDLE (MISCELLANEOUS) ×3 IMPLANT
COVER WAND RF STERILE (DRAPES) IMPLANT
CUP SUT UNIV REVERS 36 NEUTRAL (Cup) ×3 IMPLANT
DERMABOND ADVANCED (GAUZE/BANDAGES/DRESSINGS) ×2
DERMABOND ADVANCED .7 DNX12 (GAUZE/BANDAGES/DRESSINGS) ×1 IMPLANT
DRAPE INCISE IOBAN 66X45 STRL (DRAPES) IMPLANT
DRAPE ORTHO SPLIT 77X108 STRL (DRAPES) ×4
DRAPE SHEET LG 3/4 BI-LAMINATE (DRAPES) ×3 IMPLANT
DRAPE SURG 17X11 SM STRL (DRAPES) ×3 IMPLANT
DRAPE SURG ORHT 6 SPLT 77X108 (DRAPES) ×2 IMPLANT
DRAPE U-SHAPE 47X51 STRL (DRAPES) ×3 IMPLANT
DRSG AQUACEL AG ADV 3.5X10 (GAUZE/BANDAGES/DRESSINGS) ×3 IMPLANT
DURAPREP 26ML APPLICATOR (WOUND CARE) ×3 IMPLANT
ELECT BLADE TIP CTD 4 INCH (ELECTRODE) ×3 IMPLANT
ELECT REM PT RETURN 15FT ADLT (MISCELLANEOUS) ×3 IMPLANT
FACESHIELD WRAPAROUND (MASK) ×12 IMPLANT
GLENOID UNI REV MOD 24 +2 LAT (Joint) ×3 IMPLANT
GLENOSPHERE 36 +4 LAT/24 (Joint) ×3 IMPLANT
GLOVE BIO SURGEON STRL SZ7.5 (GLOVE) ×3 IMPLANT
GLOVE BIO SURGEON STRL SZ8 (GLOVE) ×3 IMPLANT
GLOVE SS BIOGEL STRL SZ 7 (GLOVE) ×1 IMPLANT
GLOVE SS BIOGEL STRL SZ 7.5 (GLOVE) ×1 IMPLANT
GLOVE SUPERSENSE BIOGEL SZ 7 (GLOVE) ×2
GLOVE SUPERSENSE BIOGEL SZ 7.5 (GLOVE) ×2
GLOVE SURG SYN 7.0 (GLOVE) IMPLANT
GLOVE SURG SYN 7.5  E (GLOVE)
GLOVE SURG SYN 7.5 E (GLOVE) IMPLANT
GLOVE SURG SYN 8.0 (GLOVE) IMPLANT
GOWN STRL REUS W/TWL LRG LVL3 (GOWN DISPOSABLE) ×6 IMPLANT
KIT BASIN OR (CUSTOM PROCEDURE TRAY) ×3 IMPLANT
KIT TURNOVER KIT A (KITS) IMPLANT
LINER HUMERAL 36 +3MM SM (Shoulder) ×3 IMPLANT
MANIFOLD NEPTUNE II (INSTRUMENTS) ×3 IMPLANT
NEEDLE TAPERED W/ NITINOL LOOP (MISCELLANEOUS) ×3 IMPLANT
NS IRRIG 1000ML POUR BTL (IV SOLUTION) ×3 IMPLANT
PACK SHOULDER (CUSTOM PROCEDURE TRAY) ×3 IMPLANT
PAD ARMBOARD 7.5X6 YLW CONV (MISCELLANEOUS) ×3 IMPLANT
PAD COLD SHLDR WRAP-ON (PAD) IMPLANT
PIN SET MODULAR GLENOID SYSTEM (PIN) ×6 IMPLANT
RESTRAINT HEAD UNIVERSAL NS (MISCELLANEOUS) ×3 IMPLANT
SCREW CENTRAL MOD 30MM (Screw) ×3 IMPLANT
SCREW PERI LOCK 5.5X16 (Screw) ×6 IMPLANT
SCREW PERIPHERAL 5.5X28 LOCK (Screw) ×6 IMPLANT
SLING ARM FOAM STRAP LRG (SOFTGOODS) IMPLANT
SLING ARM FOAM STRAP MED (SOFTGOODS) ×3 IMPLANT
SPACER SHLD UNI REV 36 +6 (Shoulder) ×2 IMPLANT
SPACER TI 36/+6MM (Shoulder) ×1 IMPLANT
SPONGE LAP 18X18 RF (DISPOSABLE) IMPLANT
STEM HUMERAL UNI REVERS SZ6 (Stem) ×3 IMPLANT
SUCTION FRAZIER HANDLE 12FR (TUBING) ×2
SUCTION TUBE FRAZIER 12FR DISP (TUBING) ×1 IMPLANT
SUT FIBERWIRE #2 38 T-5 BLUE (SUTURE)
SUT MNCRL AB 3-0 PS2 18 (SUTURE) ×3 IMPLANT
SUT MON AB 2-0 CT1 36 (SUTURE) ×3 IMPLANT
SUT VIC AB 1 CT1 36 (SUTURE) ×6 IMPLANT
SUTURE FIBERWR #2 38 T-5 BLUE (SUTURE) IMPLANT
SUTURE TAPE 1.3 40 TPR END (SUTURE) ×2 IMPLANT
SUTURETAPE 1.3 40 TPR END (SUTURE) ×6
TOWEL OR 17X26 10 PK STRL BLUE (TOWEL DISPOSABLE) ×3 IMPLANT
TOWEL OR NON WOVEN STRL DISP B (DISPOSABLE) ×3 IMPLANT
WATER STERILE IRR 1000ML POUR (IV SOLUTION) ×6 IMPLANT
YANKAUER SUCT BULB TIP 10FT TU (MISCELLANEOUS) ×3 IMPLANT

## 2019-05-24 NOTE — Op Note (Signed)
05/24/2019  2:39 PM  PATIENT:   Terri Alvarado  64 y.o. female  PRE-OPERATIVE DIAGNOSIS:  Right shoulder rotator cuff tear arthropathy  POST-OPERATIVE DIAGNOSIS: Same  PROCEDURE: Right shoulder reverse arthroplasty utilizing a press-fit size 6 Arthrex stem with a +6 spacer, +3 polyethylene insert, 36/+4 glenosphere on a small/+2 baseplate  SURGEON:  Charlina Dwight, Metta Clines M.D.  ASSISTANTS: Jenetta Loges, PA-C  ANESTHESIA:   General endotracheal and interscalene block with Exparel  EBL: 100 cc  SPECIMEN: None  Drains: None   PATIENT DISPOSITION:  PACU - hemodynamically stable.    PLAN OF CARE: Discharge to home after PACU  Brief history:  Patient is a 64 year old female who has had chronic and progressively increasing right shoulder pain with functional mutations related to advanced rotator cuff tear arthropathy.  Due to her increasing pain and failure to respond to conservative management she is brought to the operating this time for planned right shoulder reverse arthroplasty  Preoperatively I counseled Ms. Stites regarding treatment options as well as the potential risks versus benefits thereof.  Possible surgical complications were reviewed including bleeding, infection, neurovascular injury, persistence of pain, loss of motion, anesthetic complication, and possible need for additional surgery.  She understands, and accepts, and agrees with our planned procedure.  Procedure in detail:  After undergoing routine preop evaluation the patient received prophylactic antibiotics and interscalene block with Exparel was established in the holding area by the anesthesia department.  Subsequently placed supine on the operating table and underwent the smooth induction of a general endotracheal anesthesia.  Placed into the beachchair position and appropriately padded and protected.  The right shoulder girdle region was sterilely prepped and draped in standard fashion.  Timeout was called.  An  anterior deltopectoral approach through the 8 cm incision was made to the right shoulder.  Skin flaps elevated dissection carried deeply and the deltopectoral interval was developed from proximal to distal with the vein taken laterally.  We did identify a violation of the vein which necessitated ligation distally.  The upper centimeter the pectoralis major was tenotomized for exposure and the conjoined tendon was retracted medially.  There been previous rupture the long head biceps tendon.  There is a very thin veil of residual subscapularis tendon which did not appear viable and so this was divided and retracted medially.  The humeral head was then delivered through the wound and the extra medullary guide was then used to outline the proposed humeral head resection in approximately 20 degrees retroversion.  This was then performed with an oscillating saw.  Metal cap placed over the cut proximal humeral surface and at this point we exposed the glenoid and performed a circumferential labral resection gaining complete visualization of the periphery of the glenoid.  A guidepin was then directed into the center of the glenoid with an approximately 10 degree inferior tilt and the glenoid was then reamed with our central followed by the peripheral reamer to a stable subchondral bony bed and all debris was then removed.  The center drill hole was then prepared and tapped and a 30 mm lag screw was then utilized and the baseplate was then inserted achieving excellent fit and fixation.  The peripheral locking screws were all then placed with excellent fit and fixation.  The 36/+4 glenosphere was then Impacted onto the Baseplate and the Central Locking Screw Was Placed.  We Then Returned Our Attention to the Humeral Metaphysis Were the canal was broached up to a size 6 with excellent fit.  The central metaphyseal reamer was then utilized the metaphysis was repaired.  A trial implant was placed and trial reduction showed good  mobility and good soft tissue balance and good stability.  The final implant was then assembled and was then impacted achieving excellent fit and fixation.  We then performed a series of trial reductions and ultimately felt that a total of +9 off the implant gave [place] the best soft tissue balance.  This point a +6 metal spacer was then placed followed by +3 poly-.  A final reduction was performed showing good soft tissue balance good motion good stability.  The wounds then copiously irrigated.  The deltopectoral interval was reapproximated with a series of figure-of-eight #1 Vicryl sutures.  2-0 Vicryl used for the subcu layer and intracuticular 3 Monocryl for the skin followed by Dermabond and an Aquacel dressing in the right arm was then placed into a sling and the patient was awakened, extubated, and taken to the recovery room in stable condition.  Ralene Bathe, PA-C was used as an Geophysicist/field seismologist throughout this case essential for help with positioning of the patient, positioning extremity, tissue manipulation, implantation of the prosthesis, wound closure, and intraoperative decision-making.  Vania Rea Christena Sunderlin MD   Contact # [phone]

## 2019-05-24 NOTE — H&P (Signed)
Terri Alvarado    Chief Complaint: Right shoulder rotator cuff tear arthropathy HPI: The patient is a 64 y.o. female with chronic and progressively increasing right shoulder pain related to advanced rotator cuff tear arthropathy.  Due to her increasing functional limitation she is brought to the operating this time for planned right shoulder reverse arthroplasty  Past Medical History:  Diagnosis Date  . Anxiety   . Depression   . GERD (gastroesophageal reflux disease)   . Hypertension     Past Surgical History:  Procedure Laterality Date  . BACK SURGERY    . CARPAL TUNNEL RELEASE  2017   left hand  . SHOULDER SURGERY     left shoulder  . TUBAL LIGATION      History reviewed. No pertinent family history.  Social History:  reports that she quit smoking about 5 years ago. Her smoking use included cigarettes. She has a 35.00 pack-year smoking history. She has never used smokeless tobacco. She reports current alcohol use. She reports that she does not use drugs.   Medications Prior to Admission  Medication Sig Dispense Refill  . DULoxetine (CYMBALTA) 60 MG capsule Take 60 mg by mouth daily.    Marland Kitchen gabapentin (NEURONTIN) 300 MG capsule Take 900 mg by mouth 3 (three) times daily.     Marland Kitchen lisinopril-hydrochlorothiazide (ZESTORETIC) 20-12.5 MG tablet Take 1 tablet by mouth daily.    Marland Kitchen omeprazole (PRILOSEC) 40 MG capsule Take 40 mg by mouth daily.     Marland Kitchen HYDROcodone-acetaminophen (NORCO/VICODIN) 5-325 MG tablet Take 1 every 6-8 hours for pain not relieved by Tylenol alone (Patient not taking: Reported on 05/18/2019) 20 tablet 0     Physical Exam: Right shoulder demonstrates painful and guarded motion as noted at her recent office visit.  She does have a prominence of the humeral head anterosuperiorly.  Plain films confirm a high riding humeral head and changes consistent with rotator cuff tear arthropathy  Vitals  Temp:  [97.7 F (36.5 C)] 97.7 F (36.5 C) (01/21 1059) Pulse Rate:   [70-83] 72 (01/21 1215) Resp:  [19-20] 19 (01/21 1215) BP: (127-138)/(70-93) 127/70 (01/21 1215) SpO2:  [99 %-100 %] 100 % (01/21 1215) Weight:  [72.1 kg] 72.1 kg (01/21 1059)  Assessment/Plan  Impression: Right shoulder rotator cuff tear arthropathy  Plan of Action: Procedure(s): REVERSE SHOULDER ARTHROPLASTY SDDC  Fizza Scales M Dimitria Ketchum 05/24/2019, 12:45 PM Contact # [phone]

## 2019-05-24 NOTE — Discharge Instructions (Signed)
° °Kevin M. Supple, M.D., F.A.A.O.S. °Orthopaedic Surgery °Specializing in Arthroscopic and Reconstructive °Surgery of the Shoulder °[phone] °[address] - [place], [place] [address] - Fax [phone] ° ° °POST-OP TOTAL SHOULDER REPLACEMENT INSTRUCTIONS ° °1. Call the office at [phone] to schedule your first post-op appointment 10-14 days from the date of your surgery. ° °2. The bandage over your incision is waterproof. You may begin showering with this dressing on. You may leave this dressing on until first follow up appointment within 2 weeks. We prefer you leave this dressing in place until follow up however after 5-7 days if you are having itching or skin irritation and would like to remove it you may do so. Go slow and tug at the borders gently to break the bond the dressing has with the skin. At this point if there is no drainage it is okay to go without a bandage or you may cover it with a light guaze and tape. You can also expect significant bruising around your shoulder that will drift down your arm and into your chest wall. This is very normal and should resolve over several days. ° ° 3. Wear your sling/immobilizer at all times except to perform the exercises below or to occasionally let your arm dangle by your side to stretch your elbow. You also need to sleep in your sling immobilizer until instructed otherwise. It is ok to remove your sling if you are sitting in a controlled environment and allow your arm to rest in a position of comfort by your side or on your lap with pillows to give your neck and skin a break from the sling. You may remove it to allow arm to dangle by side to shower. If you are up walking around and when you go to sleep at night you need to wear it. ° °4. Range of motion to your elbow, wrist, and hand are encouraged 3-5 times daily. Exercise to your hand and fingers helps to reduce swelling you may experience. ° °5. Utilize ice to the shoulder 3-5 times  minimum a day and additionally if you are experiencing pain. ° °6. Prescriptions for a pain medication and a muscle relaxant are provided for you. It is recommended that if you are experiencing pain that you pain medication alone is not controlling, add the muscle relaxant along with the pain medication which can give additional pain relief. The first 1-2 days is generally the most severe of your pain and then should gradually decrease. As your pain lessens it is recommended that you decrease your use of the pain medications to an "as needed basis'" only and to always comply with the recommended dosages of the pain medications. ° °7. Pain medications can produce constipation along with their use. If you experience this, the use of an over the counter stool softener or laxative daily is recommended.  ° °8. For additional questions or concerns, please do not hesitate to call the office. If after hours there is an answering service to forward your concerns to the physician on call. ° °9.Pain control following an exparel block ° °To help control your post-operative pain you received a nerve block  performed with Exparel which is a long acting anesthetic (numbing agent) which can provide pain relief and sensations of numbness (and relief of pain) in the operative shoulder and arm for up to 3 days. Sometimes it provides mixed relief, meaning you may still have numbness in certain areas of the arm but can still   be able to move  parts of that arm, hand, and fingers. We recommend that your prescribed pain medications  be used as needed. We do not feel it is necessary to "pre medicate" and "stay ahead" of pain.  Taking narcotic pain medications when you are not having any pain can lead to unnecessary and potentially dangerous side effects.   ° °10. Use the ice machine as much as possible in the first 5-7 days from surgery, then you can wean its use to as needed. The ice typically needs to be replaced every 6 hours, instead of  ice you can actually freeze water bottles to put in the cooler and then fill water around them to avoid having to purchase ice. You can have spare water bottles freezing to allow you to rotate them once they have melted. Try to have a thin shirt or light cloth or towel under the ice wrap to protect your skin.  ° °11.  We recommend that you avoid any dental work or cleaning in the first 3 months following your joint replacement. This is to help minimize the possibility of infection from the bacteria in your mouth that enters your bloodstream during dental work. We also recommend that you take an antibiotic prior to your dental work for the first year after your shoulder replacement to further help reduce that risk. Please simply contact our office for antibiotics to be sent to your pharmacy prior to dental work. ° °POST-OP EXERCISES ° °Pendulum Exercises ° °Perform pendulum exercises while standing and bending at the waist. Support your uninvolved arm on a table or chair and allow your operated arm to hang freely. Make sure to do these exercises passively - not using you shoulder muscles. These exercises can be performed once your nerve block effects have worn off. ° °Repeat 20 times. Do 3 sessions per day. ° ° ° ° °

## 2019-05-24 NOTE — Anesthesia Procedure Notes (Signed)
Procedure Name: Intubation Date/Time: 05/24/2019 1:37 PM Performed by: Niel Hummer, CRNA Pre-anesthesia Checklist: Patient identified, Emergency Drugs available, Suction available and Patient being monitored Patient Re-evaluated:Patient Re-evaluated prior to induction Oxygen Delivery Method: Circle system utilized Preoxygenation: Pre-oxygenation with 100% oxygen Induction Type: IV induction Ventilation: Oral airway inserted - appropriate to patient size and Mask ventilation without difficulty Laryngoscope Size: Mac and 4 Grade View: Grade I Tube type: Oral Tube size: 7.0 mm Number of attempts: 1 Airway Equipment and Method: Stylet Placement Confirmation: positive ETCO2,  ETT inserted through vocal cords under direct vision and breath sounds checked- equal and bilateral Secured at: 21 cm Tube secured with: Tape Dental Injury: Teeth and Oropharynx as per pre-operative assessment

## 2019-05-24 NOTE — Anesthesia Preprocedure Evaluation (Addendum)
Anesthesia Evaluation  Patient identified by MRN, date of birth, ID band Patient awake    Reviewed: Allergy & Precautions, NPO status , Patient's Chart, lab work & pertinent test results  Airway Mallampati: II  TM Distance: >3 FB Neck ROM: Full    Dental  (+) Missing   Pulmonary former smoker,    Pulmonary exam normal breath sounds clear to auscultation       Cardiovascular hypertension, Pt. on medications Normal cardiovascular exam Rhythm:Regular Rate:Normal  ECG: NSR, rate 71   Neuro/Psych PSYCHIATRIC DISORDERS Anxiety Depression negative neurological ROS     GI/Hepatic Neg liver ROS, GERD  Medicated and Controlled,  Endo/Other  negative endocrine ROS  Renal/GU negative Renal ROS     Musculoskeletal negative musculoskeletal ROS (+)   Abdominal   Peds  Hematology  (+) anemia ,   Anesthesia Other Findings Right shoulder rotator cuff tear arthropathy  Reproductive/Obstetrics                            Anesthesia Physical Anesthesia Plan  ASA: II  Anesthesia Plan: General and Regional   Post-op Pain Management: GA combined w/ Regional for post-op pain   Induction: Intravenous  PONV Risk Score and Plan: 3 and Ondansetron, Dexamethasone, Midazolam and Treatment may vary due to age or medical condition  Airway Management Planned: Oral ETT  Additional Equipment:   Intra-op Plan:   Post-operative Plan: Extubation in OR  Informed Consent: I have reviewed the patients History and Physical, chart, labs and discussed the procedure including the risks, benefits and alternatives for the proposed anesthesia with the patient or authorized representative who has indicated his/her understanding and acceptance.     Dental advisory given  Plan Discussed with: CRNA  Anesthesia Plan Comments:        Anesthesia Quick Evaluation

## 2019-05-24 NOTE — Progress Notes (Signed)
Assisted Dr. Ellender with right, ultrasound guided, interscalene  block. Side rails up, monitors on throughout procedure. See vital signs in flow sheet. Tolerated Procedure well. 

## 2019-05-24 NOTE — Anesthesia Procedure Notes (Signed)
Anesthesia Regional Block: Interscalene brachial plexus block   Pre-Anesthetic Checklist: ,, timeout performed, Correct Patient, Correct Site, Correct Laterality, Correct Procedure, Correct Position, site marked, Risks and benefits discussed,  Surgical consent,  Pre-op evaluation,  At surgeon's request and post-op pain management  Laterality: Right  Prep: chloraprep       Needles:  Injection technique: Single-shot  Needle Type: Echogenic Stimulator Needle     Needle Length: 9cm  Needle Gauge: 21     Additional Needles:   Procedures:,,,, ultrasound used (permanent image in chart),,,,  Narrative:  Start time: 05/24/2019 11:40 AM End time: 05/24/2019 11:50 AM Injection made incrementally with aspirations every 5 mL.  Performed by: Personally  Anesthesiologist: Leonides Grills, MD  Additional Notes: Functioning IV was confirmed and monitors were applied.  A timeout was performed. Sterile prep, hand hygiene and sterile gloves were used. A 3mm 21ga Arrow echogenic stimulator needle was used. Negative aspiration and negative test dose prior to incremental administration of local anesthetic. The patient tolerated the procedure well.  Ultrasound guidance: relevent anatomy identified, needle position confirmed, local anesthetic spread visualized around nerve(s), vascular puncture avoided.  Image printed for medical record.

## 2019-05-24 NOTE — Anesthesia Postprocedure Evaluation (Signed)
Anesthesia Post Note  Patient: Terri Alvarado  Procedure(s) Performed: REVERSE SHOULDER ARTHROPLASTY SDDC (Right Shoulder)     Patient location during evaluation: PACU Anesthesia Type: Regional and General Level of consciousness: awake and alert Pain management: pain level controlled Vital Signs Assessment: post-procedure vital signs reviewed and stable Respiratory status: spontaneous breathing, nonlabored ventilation, respiratory function stable and patient connected to nasal cannula oxygen Cardiovascular status: blood pressure returned to baseline and stable Postop Assessment: no apparent nausea or vomiting Anesthetic complications: no    Last Vitals:  Vitals:   05/24/19 1615 05/24/19 1630  BP: (!) 111/59 100/80  Pulse: 69 72  Resp: 15 15  Temp: 36.6 C   SpO2: 93% 93%    Last Pain:  Vitals:   05/24/19 1630  TempSrc:   PainSc: 0-No pain                 Brock Mokry P Daiwik [place]

## 2019-05-24 NOTE — Transfer of Care (Signed)
Immediate Anesthesia Transfer of Care Note  Patient: Terri Alvarado  Procedure(s) Performed: REVERSE SHOULDER ARTHROPLASTY SDDC (Right Shoulder)  Patient Location: PACU  Anesthesia Type:General  Level of Consciousness: awake, alert  and oriented  Airway & Oxygen Therapy: Patient Spontanous Breathing and Patient connected to face mask oxygen  Post-op Assessment: Report given to RN and Post -op Vital signs reviewed and stable  Post vital signs: Reviewed and stable  Last Vitals:  Vitals Value Taken Time  BP    Temp    Pulse 69 05/24/19 1459  Resp 15 05/24/19 1459  SpO2 97 % 05/24/19 1459  Vitals shown include unvalidated device data.  Last Pain:  Vitals:   05/24/19 1150  TempSrc:   PainSc: 0-No pain         Complications: No apparent anesthesia complications

## 2019-05-25 ENCOUNTER — Encounter: Payer: Self-pay | Admitting: *Deleted

## 2019-06-12 DIAGNOSIS — M4150 Other secondary scoliosis, site unspecified: Secondary | ICD-10-CM | POA: Insufficient documentation

## 2019-07-01 ENCOUNTER — Ambulatory Visit: Payer: Medicare Other | Attending: Internal Medicine

## 2019-07-01 DIAGNOSIS — Z23 Encounter for immunization: Secondary | ICD-10-CM | POA: Insufficient documentation

## 2019-07-01 NOTE — Progress Notes (Signed)
   Covid-19 Vaccination Clinic  Name:  Terri Alvarado    MRN: [id number] DOB: 04-27-1956  07/01/2019  [name] was observed post Covid-19 immunization for 15 minutes without incidence. She was provided with Vaccine Information Sheet and instruction to access the V-Safe system.   [name] was instructed to call 911 with any severe reactions post vaccine: Marland Kitchen Difficulty breathing  . Swelling of your face and throat  . A fast heartbeat  . A bad rash all over your body  . Dizziness and weakness    Immunizations Administered    Name Date Dose VIS Date Route   Pfizer COVID-19 Vaccine 07/01/2019  2:10 PM 0.3 mL 04/13/2019 Intramuscular   Manufacturer: [employer], [employer]   Lot: [id number]   NDC: [address]-4

## 2019-07-05 ENCOUNTER — Other Ambulatory Visit: Payer: Self-pay | Admitting: Orthopedic Surgery

## 2019-07-09 ENCOUNTER — Other Ambulatory Visit: Payer: Self-pay | Admitting: Orthopedic Surgery

## 2019-07-11 ENCOUNTER — Other Ambulatory Visit: Payer: Self-pay | Admitting: Orthopedic Surgery

## 2019-07-11 DIAGNOSIS — M545 Low back pain, unspecified: Secondary | ICD-10-CM

## 2019-07-19 DIAGNOSIS — Z9889 Other specified postprocedural states: Secondary | ICD-10-CM | POA: Insufficient documentation

## 2019-07-23 ENCOUNTER — Ambulatory Visit
Admission: RE | Admit: 2019-07-23 | Discharge: 2019-07-23 | Disposition: A | Discharge status: Home / Self Care | Payer: Medicare Other | Source: Ambulatory Visit | Attending: Orthopedic Surgery | Admitting: Orthopedic Surgery

## 2019-07-23 DIAGNOSIS — M545 Low back pain, unspecified: Secondary | ICD-10-CM

## 2019-07-31 ENCOUNTER — Ambulatory Visit: Payer: Medicare Other | Attending: Internal Medicine

## 2019-07-31 DIAGNOSIS — Z23 Encounter for immunization: Secondary | ICD-10-CM

## 2019-07-31 NOTE — Progress Notes (Signed)
   Covid-19 Vaccination Clinic  Name:  BANESSA MAO    MRN: [id number] DOB: [birth year]  07/31/2019  Ms. Weill was observed post Covid-19 immunization for 15 minutes without incident. She was provided with Vaccine Information Sheet and instruction to access the V-Safe system.   Ms. Radi was instructed to call 911 with any severe reactions post vaccine: Marland Kitchen Difficulty breathing  . Swelling of face and throat  . A fast heartbeat  . A bad rash all over body  . Dizziness and weakness   Immunizations Administered    Name Date Dose VIS Date Route   Pfizer COVID-19 Vaccine 07/31/2019  9:51 AM 0.3 mL 04/13/2019 Intramuscular   Manufacturer: [employer], [employer]   Lot: [id number]   NDC: [address]-9      Covid-19 Vaccination Clinic  Name:  ENZLEY KITCHENS    MRN: [id number] DOB: 09-20-1955  07/31/2019  Ms. Rappaport was observed post Covid-19 immunization for 15 minutes without incident. She was provided with Vaccine Information Sheet and instruction to access the V-Safe system.   Ms. Bronkema was instructed to call 911 with any severe reactions post vaccine: Marland Kitchen Difficulty breathing  . Swelling of face and throat  . A fast heartbeat  . A bad rash all over body  . Dizziness and weakness   Immunizations Administered    Name Date Dose VIS Date Route   Pfizer COVID-19 Vaccine 07/31/2019  9:51 AM 0.3 mL 04/13/2019 Intramuscular   Manufacturer: [employer], [employer]   Lot: [id number]   NDC: [address]-5

## 2019-08-13 ENCOUNTER — Ambulatory Visit: Payer: Self-pay | Admitting: Orthopedic Surgery

## 2019-08-13 ENCOUNTER — Other Ambulatory Visit: Payer: Self-pay

## 2019-08-15 ENCOUNTER — Other Ambulatory Visit: Payer: Self-pay

## 2019-08-31 ENCOUNTER — Telehealth (HOSPITAL_COMMUNITY): Payer: Self-pay

## 2019-08-31 NOTE — Telephone Encounter (Signed)

## 2019-09-03 ENCOUNTER — Ambulatory Visit: Payer: Self-pay | Admitting: Orthopedic Surgery

## 2019-09-03 ENCOUNTER — Ambulatory Visit (INDEPENDENT_AMBULATORY_CARE_PROVIDER_SITE_OTHER): Payer: Medicare Other | Admitting: Surgery

## 2019-09-03 ENCOUNTER — Encounter: Payer: Self-pay | Admitting: Surgery

## 2019-09-03 ENCOUNTER — Other Ambulatory Visit: Payer: Self-pay

## 2019-09-03 VITALS — BP 116/76 | HR 81 | Temp 97.6°F | Resp 20 | Ht 63.0 in | Wt 156.0 lb

## 2019-09-03 DIAGNOSIS — M479 Spondylosis, unspecified: Secondary | ICD-10-CM | POA: Diagnosis not present

## 2019-09-03 NOTE — H&P (View-Only) (Signed)
[facility]  Patient name: Terri Alvarado MRN: [id number] DOB: 05/09/1955 Sex: female   REQUESTING PROVIDER:    Dr. Shon Baton   REASON FOR CONSULT:    Degenerative back disease  HISTORY OF PRESENT ILLNESS:   Terri Alvarado is a 64 y.o. female, who is referred today for discussions regarding anterior exposure of the L5-S1 disc space.  The patient has never had abdominal surgery.  Her main medical issues are related to her lower back.  She has failed nonoperative treatment and is scheduled for surgery by Dr. Shon Baton  PAST MEDICAL HISTORY    Past Medical History:  Diagnosis Date  . Anxiety   . Degeneration of lumbar intervertebral disc   . Degenerative scoliosis   . Depression   . GERD (gastroesophageal reflux disease)   . Hypertension   . Low back pain   . Lumbar radiculopathy   . Lumbar spondylosis   . Neurogenic claudication   . Pain in joint of right shoulder   . Pain of left hip joint   . Rotator cuff arthropathy of right shoulder   . Spinal stenosis      FAMILY HISTORY   History reviewed. No pertinent family history.  SOCIAL HISTORY:   Social History   Socioeconomic History  . Marital status: Married    Spouse name: Not on file  . Number of children: Not on file  . Years of education: Not on file  . Highest education level: Not on file  Occupational History  . Not on file  Tobacco Use  . Smoking status: Former Smoker    Packs/day: 1.00    Years: 35.00    Pack years: 35.00    Types: Cigarettes    Quit date: 06/12/2013    Years since quitting: 6.2  . Smokeless tobacco: Never Used  Substance and Sexual Activity  . Alcohol use: Yes    Comment: occassionally  . Drug use: Never  . Sexual activity: Not on file  Other Topics Concern  . Not on file  Social History Narrative  . Not on file   Social Determinants of Health   Financial Resource Strain:   . Difficulty of Paying Living  Expenses:   Food Insecurity:   . Worried About Programme researcher, broadcasting/film/video in the Last Year:   . Barista in the Last Year:   Transportation Needs:   . Freight forwarder (Medical):   Marland Kitchen Lack of Transportation (Non-Medical):   Physical Activity:   . Days of Exercise per Week:   . Minutes of Exercise per Session:   Stress:   . Feeling of Stress :   Social Connections:   . Frequency of Communication with Friends and Family:   . Frequency of Social Gatherings with Friends and Family:   . Attends Religious Services:   . Active Member of Clubs or Organizations:   . Attends Banker Meetings:   Marland Kitchen Marital Status:   Intimate Partner Violence:   . Fear of Current or Ex-Partner:   . Emotionally Abused:   Marland Kitchen Physically Abused:   . Sexually Abused:     ALLERGIES:    No Known Allergies  CURRENT MEDICATIONS:    Current Outpatient Medications  Medication Sig Dispense Refill  . DULoxetine (CYMBALTA) 60 MG capsule Take 60 mg by mouth daily.     Marland Kitchen gabapentin (NEURONTIN) 300 MG capsule Take 300-600 mg by mouth daily as needed (pain).     Marland Kitchen  lisinopril-hydrochlorothiazide (ZESTORETIC) 20-12.5 MG tablet Take 1 tablet by mouth daily.     . omeprazole (PRILOSEC) 40 MG capsule Take 40 mg by mouth daily.     . cyclobenzaprine (FLEXERIL) 10 MG tablet Take 1 tablet (10 mg total) by mouth 3 (three) times daily as needed for muscle spasms. (Patient not taking: Reported on 08/28/2019) 30 tablet 1  . naproxen (NAPROSYN) 500 MG tablet Take 1 tablet (500 mg total) by mouth 2 (two) times daily with a meal. (Patient not taking: Reported on 08/28/2019) 60 tablet 1  . ondansetron (ZOFRAN) 4 MG tablet Take 1 tablet (4 mg total) by mouth every 8 (eight) hours as needed for nausea or vomiting. (Patient not taking: Reported on 08/28/2019) 10 tablet 0  . oxyCODONE-acetaminophen (PERCOCET) 5-325 MG tablet Take 1 tablet by mouth every 4 (four) hours as needed (max 6 q). (Patient not taking: Reported on  08/28/2019) 20 tablet 0   No current facility-administered medications for this visit.    REVIEW OF SYSTEMS:   [X] denotes positive finding, [ ] denotes negative finding Cardiac  Comments:  Chest pain or chest pressure:    Shortness of breath upon exertion:    Short of breath when lying flat:    Irregular heart rhythm:        Vascular    Pain in calf, thigh, or hip brought on by ambulation:    Pain in feet at night that wakes you up from your sleep:     Blood clot in your veins:    Leg swelling:         Pulmonary    Oxygen at home:    Productive cough:     Wheezing:         Neurologic    Sudden weakness in arms or legs:     Sudden numbness in arms or legs:     Sudden onset of difficulty speaking or slurred speech:    Temporary loss of vision in one eye:     Problems with dizziness:         Gastrointestinal    Blood in stool:      Vomited blood:         Genitourinary    Burning when urinating:     Blood in urine:        Psychiatric    Major depression:         Hematologic    Bleeding problems:    Problems with blood clotting too easily:        Skin    Rashes or ulcers:        Constitutional    Fever or chills:     PHYSICAL EXAM:   Vitals:   09/03/19 1047  BP: 116/76  Pulse: 81  Resp: 20  Temp: 97.6 F (36.4 C)  SpO2: 96%  Weight: 156 lb (70.8 kg)  Height: 5' 3" (1.6 m)    GENERAL: The patient is a well-nourished female, in no acute distress. The vital signs are documented above. CARDIAC: There is a regular rate and rhythm.  VASCULAR: Palpable pedal pulses bilaterally PULMONARY: Nonlabored respirations ABDOMEN: Soft and non-tender with normal pitched bowel sounds.  MUSCULOSKELETAL: There are no major deformities or cyanosis. NEUROLOGIC: No focal weakness or paresthesias are detected. SKIN: There are no ulcers or rashes noted. PSYCHIATRIC: The patient has a normal affect.  STUDIES:   Your CT scan which shows mild calcification.  The aortic  bifurcation is proximal to the L5-S1 disc   space  ASSESSMENT and PLAN   I discussed with the patient that I feel she is a good candidate for anterior exposure of the L5-S1 disc space.  We discussed the risks and benefits the operation including the risk of injury to the iliac artery or vein.  We also discussed the possibility of injury to the ureter.  We discussed wound issues including hernia and wound infection.  All of their questions were answered.  She wants to proceed with scheduled surgery on May 12.   Leia Alf, MD, FACS [facility] [phone] Pager [phone]

## 2019-09-03 NOTE — H&P (Signed)
Subjective:   Terri Alvarado is a very pleasant 64 year old and has had a previous lumbar decompression and now has debilitating back buttock and radicular leg pain. Despite injection therapy, and self directed exercises and activity modification her quality-of-life his continue to deteriorate. At this time she would like to move forward with surgery. Today, complains of severe 8/10 pain and radicular left leg pain and subjective leg weakness. She is not taking any medication for pain as she does not find these helpful. She is scheduled for 2 days surgery: Day 1 ALIF L5-S1, XLIF L3-5; day 2 PSFI L3-S1, Kyphoplasty L2 on 5/12 and 5/13.  Patient Active Problem List   Diagnosis Date Noted  . History of reverse total replacement of right shoulder joint 07/19/2019  . Degenerative scoliosis 06/12/2019  . Rotator cuff arthropathy of right shoulder 04/26/2019  . Pain of left hip joint 04/12/2019  . Pain in joint of right shoulder 01/19/2019  . Spinal stenosis 07/06/2018  . Lumbar spondylosis 06/08/2018  . Neurogenic claudication 06/08/2018  . Depression with anxiety 11/09/2017  . Vitamin D deficiency 11/09/2017  . Lumbar radiculopathy 09/28/2016  . Degeneration of lumbar intervertebral disc 09/06/2016  . Barrett's esophagus without dysplasia 11/03/2015  . Anemia 08/14/2015  . Complete tear of right rotator cuff 07/24/2015  . Encounter for general adult medical examination without abnormal findings 03/24/2015  . Essential hypertension with goal blood pressure less than 140/90 03/01/2014  . Primary osteoarthritis involving multiple joints 02/01/2014  . Gastroesophageal reflux disease without esophagitis 12/21/2013   Past Medical History:  Diagnosis Date  . Anxiety   . Degeneration of lumbar intervertebral disc   . Degenerative scoliosis   . Depression   . GERD (gastroesophageal reflux disease)   . Hypertension   . Low back pain   . Lumbar radiculopathy   . Lumbar spondylosis   . Neurogenic  claudication   . Pain in joint of right shoulder   . Pain of left hip joint   . Rotator cuff arthropathy of right shoulder   . Spinal stenosis     Past Surgical History:  Procedure Laterality Date  . BACK SURGERY    . CARPAL TUNNEL RELEASE  2017   left hand  . REVERSE SHOULDER ARTHROPLASTY Right 05/24/2019   Procedure: REVERSE SHOULDER ARTHROPLASTY SDDC;  Surgeon: Francena Hanly, MD;  Location: [facility];  Service: Orthopedics;  Laterality: Right;   . SHOULDER SURGERY     left shoulder  . TUBAL LIGATION      Current Outpatient Medications  Medication Sig Dispense Refill Last Dose  . cyclobenzaprine (FLEXERIL) 10 MG tablet Take 1 tablet (10 mg total) by mouth 3 (three) times daily as needed for muscle spasms. (Patient not taking: Reported on 08/28/2019) 30 tablet 1   . DULoxetine (CYMBALTA) 60 MG capsule Take 60 mg by mouth daily.      Marland Kitchen gabapentin (NEURONTIN) 300 MG capsule Take 300-600 mg by mouth daily as needed (pain).      Marland Kitchen lisinopril-hydrochlorothiazide (ZESTORETIC) 20-12.5 MG tablet Take 1 tablet by mouth daily.      . naproxen (NAPROSYN) 500 MG tablet Take 1 tablet (500 mg total) by mouth 2 (two) times daily with a meal. (Patient not taking: Reported on 08/28/2019) 60 tablet 1   . omeprazole (PRILOSEC) 40 MG capsule Take 40 mg by mouth daily.      . ondansetron (ZOFRAN) 4 MG tablet Take 1 tablet (4 mg total) by mouth every 8 (eight) hours as needed for nausea or  vomiting. (Patient not taking: Reported on 08/28/2019) 10 tablet 0   . oxyCODONE-acetaminophen (PERCOCET) 5-325 MG tablet Take 1 tablet by mouth every 4 (four) hours as needed (max 6 q). (Patient not taking: Reported on 08/28/2019) 20 tablet 0    No current facility-administered medications for this visit.   No Known Allergies  Social History   Tobacco Use  . Smoking status: Former Smoker    Packs/day: 1.00    Years: 35.00    Pack years: 35.00    Types: Cigarettes    Quit date: 06/12/2013    Years since quitting:  6.2  . Smokeless tobacco: Never Used  Substance Use Topics  . Alcohol use: Yes    Comment: occassionally    No family history on file.  Review of Systems As stated in HPI  Objective:   Vitals: Ht: 5 ft 3 in 09/03/2019 02:27 pm Wt: 156 lbs 09/03/2019 02:28 pm BMI: 27.6 09/03/2019 02:28 pm BP: 110/71 09/03/2019 02:29 pm Pulse: 92 bpm 09/03/2019 02:29 pm T: 97.9 F 09/03/2019 02:29 pm Pain Scale: 8 09/03/2019 02:28 pm  General: AAOX3, well developed and well nourished, NAD  Ambulation: abnormal gait pattern Due to significant radicular left leg pain, uses no assistive device.  Heart: Regular rate and rhythm, no rubs, murmurs, or gallops  Lungs: Clear auscultation bilaterally  Abdomen: Bowel sounds 4, nondistended, nontender, no rebound tenderness, no loss of bladder or bowel control.  Neuro: 5/5 motor strength bilaterally in the lower extremity. Positive numbness and dysesthesias bilaterally in the lower extremity left side worse than the right. She describes decreased sensation light touch and dysesthesias primarily in the lateral and anterior thigh and occasionally below the level of the knee. Negative straight leg raise test. Negative Babinski test, no clonus, negative straight leg raise test.   Musculoskeletal: Significant back pain with extension of the spine. Relief of back and radicular leg pain with forward flexion. Minimal pain with rotation of the hips. No significant pain with knee and ankle range of motion.  PV: Extremities warm and well profused. Posterior and dorsalis pedis pulse 2+ bilaterally, No pitting Edema, discoloration, calf tenderness  CT lumbar spine: completed on 07/23/19 was reviewed with the patient. It was completed at [facility]; I have independently reviewed the images as well as the radiology report. Acute to subacute L2 compression deformity. No significant spinal stenosis or foraminal stenosis at L1-2, mild to moderate stenosis at L2-3. L3-4:  Significant degenerative disc disease with previous left laminectomy and partial facetectomy. Spinal stenosis has improved from prior examination before surgery. L4-5: Disc space degeneration stable mild stenosis as well as foraminal stenosis/lateral recess stenosis. L5-S1: Slight lateral recess stenosis proximally moderate along with moderate foraminal narrowing. Positive degenerative disc disease.  Lumbar MRI dated 06/11/19 was also read reviewed. Should be noted that there was transitional anatomy and the CT scan confirms an acute/subacute L2 compression fracture which corresponds to the L1 fracture mentioned on the MRI. In addition the CT scan demonstrates significant disease at the L3-4 and L4-5 level which corresponds to the L2-3 and L3-4 level noted on the MRI. The degenerative disease at L5-S1 seen on the CT corresponds to disease noted at L4-5 on the MRI.  Primary area of pathology based on the CT scan is the L2 compression fracture and the disease at the L3-4 and L4 5 levels. This correlates with the MRI findings at L1, and L4-5 and L5-S1.  X-rays of the lumbar spine taken Previous visit demonstrate the degenerative scoliosis  of the lumbar spine with multilevel degenerative disc disease. Utilizing the numbering scheme on the CT scan the patient has primary disease at L3-4 and L4-5 and to a lesser degree L5-S1. Mild to moderate disease at L2-3. They also have the compression deformity at L2.  Assessment:   Terri Alvarado is a very pleasant 64 year old and has had a previous lumbar decompression and now has debilitating back buttock and radicular leg pain. Despite injection therapy, and self directed exercises and activity modification her quality-of-life his continue to deteriorate. At this time she would like to move forward with surgery. Of note, the patient does have transitional anatomy. Utilizing the numbering scheme of the CT scan I believe her primary area of pathology is L3-4 and L4-5. However to  address the deformity and degenerative disc disease would require a fusion and therefore the L5-S1 level needs to be included as it is significantly degenerated although stable. The likelihood of adjacent segment disease is quite high and she would more than likely have continued back pain if it is not. In addition I would also address the acute/subacute L2 compression fracture. I have gone over all of her tests and studies with her and all of her questions were encouraged and addressed.    Plan:    Surgical plan: To address the 3 level degenerative disc disease I will plan on doing an anterior lumbar interbody fusion at L5-S1, and lateral interbody fusions at L3-4 and L4-5. On the second day I would do an L2 kyphoplasty to address the compression fracture and then supplement the anterior interbody fusions with the pedicle screw construct. I have gone over the surgical procedure in great detail including specific risks of the surgical procedure.   OLIF/XLIF risks, benefits of surgery were reviewed with the patient. These include: infection, bleeding, death, stroke, paralysis, ongoing or worse pain, need for additional surgery, injury to the lumbar plexus resulting in hip flexor weakness and difficulty walking without assistive devices. Adjacent segment degenerative disease, need for additional surgery including fusing other levels, leak of spinal fluid, Nonunion, hardware failure, breakage, or mal-position. Deep venous thrombosis (DVT) requiring additional treatment such as filter, and/or medications. Injury to abdominal contents, loss in bowel and bladder control.   Risks and benefits of spinal fusion: Infection, bleeding, death, stroke, paralysis, ongoing or worse pain, need for additional surgery, nonunion, leak of spinal fluid, adjacent segment degeneration requiring additional fusion surgery, Injury to abdominal vessels that can require anterior surgery to stop bleeding. Malposition of the cage  and/or pedicle screws that could require additional surgery. Loss of bowel and bladder control. Postoperative hematoma causing neurologic compression that could require urgent or emergent re-operation.   Risks of kyphoplasty include: Infection, bleeding, death, stroke, paralysis, nerve damage, leak of cement, need for additional surgery including open decompression. Ongoing or worse pain.   Goals of surgery: Reduction in pain, and improvement in quality of life   Treatment plan: We will get preoperative medical clearance from her primary care physician as well as the vascular surgeon. She will require an external bone stimulator after surgery for about 9 months given the multilevel nature of the procedure.   We have obtained preoperative medical clearance from the patient's PCP.   Patient saw vascular surgeon who is doing the approach this morning. We will move forward as discussed pending his note and recommendations.   I reviewed the patient's medication list with her. She is not on any blood thinners, not on aspirin. She is not taking any over-the-counter  anti-inflammatory medications. I have advised her not to use any over-the-counter anti-inflammatory medications. She will continue with Cymbalta and Tylenol as needed for pain. She is not on any vitamins or supplements.   We have also discussed the post-operative recovery period to include: bathing/showering restrictions, wound healing, activity (and driving) restrictions, medications/pain mangement.   We have also discussed post-operative redflags to include: signs and symptoms of postoperative infection, DVT/PE.   Patient has appointment with physical therapy this afternoon for brace fitting.   Patient has appointment at [facility] on Monday for her preoperative testing.   All patients questions were invited and answered   Plan is to move forward with a 2 day surgery on 09/12/2019 and 09/13/2019 pending  recommendations from vascular surgery and preoperative testing at [facility].

## 2019-09-03 NOTE — Progress Notes (Signed)
[facility] [place]  Patient name: Terri Alvarado MRN: [id number] DOB: 05/09/1955 Sex: female   REQUESTING PROVIDER:    Dr. Shon Baton   REASON FOR CONSULT:    Degenerative back disease  HISTORY OF PRESENT ILLNESS:   Terri Alvarado is a 64 y.o. female, who is referred today for discussions regarding anterior exposure of the L5-S1 disc space.  The patient has never had abdominal surgery.  Her main medical issues are related to her lower back.  She has failed nonoperative treatment and is scheduled for surgery by Dr. Shon Baton  PAST MEDICAL HISTORY    Past Medical History:  Diagnosis Date  . Anxiety   . Degeneration of lumbar intervertebral disc   . Degenerative scoliosis   . Depression   . GERD (gastroesophageal reflux disease)   . Hypertension   . Low back pain   . Lumbar radiculopathy   . Lumbar spondylosis   . Neurogenic claudication   . Pain in joint of right shoulder   . Pain of left hip joint   . Rotator cuff arthropathy of right shoulder   . Spinal stenosis      FAMILY HISTORY   History reviewed. No pertinent family history.  SOCIAL HISTORY:   Social History   Socioeconomic History  . Marital status: Married    Spouse name: Not on file  . Number of children: Not on file  . Years of education: Not on file  . Highest education level: Not on file  Occupational History  . Not on file  Tobacco Use  . Smoking status: Former Smoker    Packs/day: 1.00    Years: 35.00    Pack years: 35.00    Types: Cigarettes    Quit date: 06/12/2013    Years since quitting: 6.2  . Smokeless tobacco: Never Used  Substance and Sexual Activity  . Alcohol use: Yes    Comment: occassionally  . Drug use: Never  . Sexual activity: Not on file  Other Topics Concern  . Not on file  Social History Narrative  . Not on file   Social Determinants of Health   Financial Resource Strain:   . Difficulty of Paying Living  Expenses:   Food Insecurity:   . Worried About Programme researcher, broadcasting/film/video in the Last Year:   . Barista in the Last Year:   Transportation Needs:   . Freight forwarder (Medical):   Marland Kitchen Lack of Transportation (Non-Medical):   Physical Activity:   . Days of Exercise per Week:   . Minutes of Exercise per Session:   Stress:   . Feeling of Stress :   Social Connections:   . Frequency of Communication with Friends and Family:   . Frequency of Social Gatherings with Friends and Family:   . Attends Religious Services:   . Active Member of Clubs or Organizations:   . Attends Banker Meetings:   Marland Kitchen Marital Status:   Intimate Partner Violence:   . Fear of Current or Ex-Partner:   . Emotionally Abused:   Marland Kitchen Physically Abused:   . Sexually Abused:     ALLERGIES:    No Known Allergies  CURRENT MEDICATIONS:    Current Outpatient Medications  Medication Sig Dispense Refill  . DULoxetine (CYMBALTA) 60 MG capsule Take 60 mg by mouth daily.     Marland Kitchen gabapentin (NEURONTIN) 300 MG capsule Take 300-600 mg by mouth daily as needed (pain).     Marland Kitchen  lisinopril-hydrochlorothiazide (ZESTORETIC) 20-12.5 MG tablet Take 1 tablet by mouth daily.     Marland Kitchen omeprazole (PRILOSEC) 40 MG capsule Take 40 mg by mouth daily.     . cyclobenzaprine (FLEXERIL) 10 MG tablet Take 1 tablet (10 mg total) by mouth 3 (three) times daily as needed for muscle spasms. (Patient not taking: Reported on 08/28/2019) 30 tablet 1  . naproxen (NAPROSYN) 500 MG tablet Take 1 tablet (500 mg total) by mouth 2 (two) times daily with a meal. (Patient not taking: Reported on 08/28/2019) 60 tablet 1  . ondansetron (ZOFRAN) 4 MG tablet Take 1 tablet (4 mg total) by mouth every 8 (eight) hours as needed for nausea or vomiting. (Patient not taking: Reported on 08/28/2019) 10 tablet 0  . oxyCODONE-acetaminophen (PERCOCET) 5-325 MG tablet Take 1 tablet by mouth every 4 (four) hours as needed (max 6 q). (Patient not taking: Reported on  08/28/2019) 20 tablet 0   No current facility-administered medications for this visit.    REVIEW OF SYSTEMS:   [X]  denotes positive finding, [ ]  denotes negative finding Cardiac  Comments:  Chest pain or chest pressure:    Shortness of breath upon exertion:    Short of breath when lying flat:    Irregular heart rhythm:        Vascular    Pain in calf, thigh, or hip brought on by ambulation:    Pain in feet at night that wakes you up from your sleep:     Blood clot in your veins:    Leg swelling:         Pulmonary    Oxygen at home:    Productive cough:     Wheezing:         Neurologic    Sudden weakness in arms or legs:     Sudden numbness in arms or legs:     Sudden onset of difficulty speaking or slurred speech:    Temporary loss of vision in one eye:     Problems with dizziness:         Gastrointestinal    Blood in stool:      Vomited blood:         Genitourinary    Burning when urinating:     Blood in urine:        Psychiatric    Major depression:         Hematologic    Bleeding problems:    Problems with blood clotting too easily:        Skin    Rashes or ulcers:        Constitutional    Fever or chills:     PHYSICAL EXAM:   Vitals:   09/03/19 1047  BP: 116/76  Pulse: 81  Resp: 20  Temp: 97.6 F (36.4 C)  SpO2: 96%  Weight: 156 lb (70.8 kg)  Height: 5\' 3"  (1.6 m)    GENERAL: The patient is a well-nourished female, in no acute distress. The vital signs are documented above. CARDIAC: There is a regular rate and rhythm.  VASCULAR: Palpable pedal pulses bilaterally PULMONARY: Nonlabored respirations ABDOMEN: Soft and non-tender with normal pitched bowel sounds.  MUSCULOSKELETAL: There are no major deformities or cyanosis. NEUROLOGIC: No focal weakness or paresthesias are detected. SKIN: There are no ulcers or rashes noted. PSYCHIATRIC: The patient has a normal affect.  STUDIES:   Your CT scan which shows mild calcification.  The aortic  bifurcation is proximal to the L5-S1 disc  space  ASSESSMENT and PLAN   I discussed with the patient that I feel she is a good candidate for anterior exposure of the L5-S1 disc space.  We discussed the risks and benefits the operation including the risk of injury to the iliac artery or vein.  We also discussed the possibility of injury to the ureter.  We discussed wound issues including hernia and wound infection.  All of their questions were answered.  She wants to proceed with scheduled surgery on May 12.   Leia Alf, MD, FACS [facility] [phone] Pager [phone]

## 2019-09-07 NOTE — Progress Notes (Signed)
[facility] [address], [place] - [address] [place] [address] Phone: [phone] Fax: [phone]      Your procedure is scheduled on Thursday, Sep 13, 2019.  Report to [facility] Main Entrance "A" at 5:30 A.M., and check in at the Admitting office.  Call this number if you have problems the morning of surgery:  [phone]  Call [phone] if you have any questions prior to your surgery date Monday-Friday 8am-4pm    Remember:  Do not eat or drink after midnight the night before your surgery    Take these medicines the morning of surgery with A SIP OF WATER:  DULoxetine (CYMBALTA) omeprazole (PRILOSEC)  gabapentin (NEURONTIN) - if needed   As of today, STOP taking any Aspirin (unless otherwise instructed by your surgeon) and Aspirin containing products, Aleve, Naproxen, Ibuprofen, Motrin, Advil, Goody's, BC's, all herbal medications, fish oil, and all vitamins.                      Do not wear jewelry, make up, or nail polish            Do not wear lotions, powders, perfumes, or deodorant.            Do not shave 48 hours prior to surgery.            Do not bring valuables to the hospital.            [facility] is not responsible for any belongings or valuables.  Do NOT Smoke (Tobacco/Vapping) or drink Alcohol 24 hours prior to your procedure If you use a CPAP at night, you may bring all equipment for your overnight stay.   Contacts, glasses, dentures or bridgework may not be worn into surgery.      For patients admitted to the hospital, discharge time will be determined by your treatment team.   Patients discharged the day of surgery will not be allowed to drive home, and someone needs to stay with them for 24 hours.    Special instructions:   [place]- Preparing For Surgery  Before surgery, you can play an important role. Because skin is not sterile, your skin needs to be as free of germs as possible. You can reduce the number of germs  on your skin by washing with CHG (chlorahexidine gluconate) Soap before surgery.  CHG is an antiseptic cleaner which kills germs and bonds with the skin to continue killing germs even after washing.    Oral Hygiene is also important to reduce your risk of infection.  Remember - BRUSH YOUR TEETH THE MORNING OF SURGERY WITH YOUR REGULAR TOOTHPASTE  Please do not use if you have an allergy to CHG or antibacterial soaps. If your skin becomes reddened/irritated stop using the CHG.  Do not shave (including legs and underarms) for at least 48 hours prior to first CHG shower. It is OK to shave your face.  Please follow these instructions carefully.   1. Shower the NIGHT BEFORE SURGERY and the MORNING OF SURGERY with CHG Soap.   2. If you chose to wash your hair, wash your hair first as usual with your normal shampoo.  3. After you shampoo, rinse your hair and body thoroughly to remove the shampoo.  4. Use CHG as you would any other liquid soap. You can apply CHG directly to the skin and wash gently with a scrungie or a clean washcloth.   5. Apply  the CHG Soap to your body ONLY FROM THE NECK DOWN.  Do not use on open wounds or open sores. Avoid contact with your eyes, ears, mouth and genitals (private parts). Wash Face and genitals (private parts)  with your normal soap.   6. Wash thoroughly, paying special attention to the area where your surgery will be performed.  7. Thoroughly rinse your body with warm water from the neck down.  8. DO NOT shower/wash with your normal soap after using and rinsing off the CHG Soap.  9. Pat yourself dry with a CLEAN TOWEL.  10. Wear CLEAN PAJAMAS to bed the night before surgery, wear comfortable clothes the morning of surgery  11. Place CLEAN SHEETS on your bed the night of your first shower and DO NOT SLEEP WITH PETS.   Day of Surgery:   Do not apply any deodorants/lotions.  Please wear clean clothes to the hospital/surgery center.   Remember to brush  your teeth WITH YOUR REGULAR TOOTHPASTE.   Please read over the following fact sheets that you were given.

## 2019-09-10 ENCOUNTER — Ambulatory Visit (HOSPITAL_COMMUNITY)
Admission: RE | Admit: 2019-09-10 | Discharge: 2019-09-10 | Disposition: A | Discharge status: Home / Self Care | Payer: Medicare Other | Source: Ambulatory Visit | Attending: Orthopedic Surgery | Admitting: Orthopedic Surgery

## 2019-09-10 ENCOUNTER — Encounter (HOSPITAL_COMMUNITY)
Admission: RE | Admit: 2019-09-10 | Discharge: 2019-09-10 | Disposition: A | Discharge status: Home / Self Care | Payer: Medicare Other | Source: Ambulatory Visit | Attending: Orthopedic Surgery | Admitting: Orthopedic Surgery

## 2019-09-10 ENCOUNTER — Other Ambulatory Visit (HOSPITAL_COMMUNITY)
Admission: RE | Admit: 2019-09-10 | Discharge: 2019-09-10 | Disposition: A | Discharge status: Home / Self Care | Payer: Medicare Other | Source: Ambulatory Visit | Attending: Orthopedic Surgery | Admitting: Orthopedic Surgery

## 2019-09-10 ENCOUNTER — Encounter (HOSPITAL_COMMUNITY): Payer: Self-pay

## 2019-09-10 ENCOUNTER — Ambulatory Visit: Payer: Self-pay | Admitting: Orthopedic Surgery

## 2019-09-10 ENCOUNTER — Other Ambulatory Visit: Payer: Self-pay

## 2019-09-10 DIAGNOSIS — Z01818 Encounter for other preprocedural examination: Secondary | ICD-10-CM

## 2019-09-10 DIAGNOSIS — Z20822 Contact with and (suspected) exposure to covid-19: Secondary | ICD-10-CM | POA: Insufficient documentation

## 2019-09-10 DIAGNOSIS — Z96611 Presence of right artificial shoulder joint: Secondary | ICD-10-CM | POA: Insufficient documentation

## 2019-09-10 DIAGNOSIS — D649 Anemia, unspecified: Secondary | ICD-10-CM | POA: Insufficient documentation

## 2019-09-10 LAB — URINALYSIS, ROUTINE W REFLEX MICROSCOPIC
Bilirubin Urine: NEGATIVE
Glucose, UA: NEGATIVE mg/dL
Hgb urine dipstick: NEGATIVE
Ketones, ur: NEGATIVE mg/dL
Nitrite: NEGATIVE
Protein, ur: NEGATIVE mg/dL
Specific Gravity, Urine: 1.019 (ref 1.005–1.030)
pH: 5 (ref 5.0–8.0)

## 2019-09-10 LAB — PROTIME-INR
INR: 0.9 (ref 0.8–1.2)
Prothrombin Time: 12.1 seconds (ref 11.4–15.2)

## 2019-09-10 LAB — CBC
HCT: 31 % — ABNORMAL LOW (ref 36.0–46.0)
Hemoglobin: 8.6 g/dL — ABNORMAL LOW (ref 12.0–15.0)
MCH: 20.1 pg — ABNORMAL LOW (ref 26.0–34.0)
MCHC: 27.7 g/dL — ABNORMAL LOW (ref 30.0–36.0)
MCV: 72.4 fL — ABNORMAL LOW (ref 80.0–100.0)
Platelets: 396 10*3/uL (ref 150–400)
RBC: 4.28 MIL/uL (ref 3.87–5.11)
RDW: 17 % — ABNORMAL HIGH (ref 11.5–15.5)
WBC: 5.2 10*3/uL (ref 4.0–10.5)
nRBC: 0 % (ref 0.0–0.2)

## 2019-09-10 LAB — BASIC METABOLIC PANEL
Anion gap: 12 (ref 5–15)
BUN: 24 mg/dL — ABNORMAL HIGH (ref 8–23)
CO2: 21 mmol/L — ABNORMAL LOW (ref 22–32)
Calcium: 9.3 mg/dL (ref 8.9–10.3)
Chloride: 104 mmol/L (ref 98–111)
Creatinine, Ser: 0.88 mg/dL (ref 0.44–1.00)
GFR calc Af Amer: >60 mL/min (ref >60)
GFR calc non Af Amer: >60 mL/min (ref >60)
Glucose, Bld: 109 mg/dL — ABNORMAL HIGH (ref 70–99)
Potassium: 4.3 mmol/L (ref 3.5–5.1)
Sodium: 137 mmol/L (ref 135–145)

## 2019-09-10 LAB — SURGICAL PCR SCREEN
MRSA, PCR: NEGATIVE
Staphylococcus aureus: NEGATIVE

## 2019-09-10 LAB — SARS CORONAVIRUS 2 (TAT 6-24 HRS): SARS Coronavirus 2: NEGATIVE

## 2019-09-10 LAB — APTT: aPTT: 27 seconds (ref 24–36)

## 2019-09-10 LAB — ABO/RH: ABO/RH(D): O POS

## 2019-09-10 NOTE — Progress Notes (Addendum)
PCP - [facility] in [place], Cardiologist - na   Chest x-ray - 09/10/19 EKG - 1/21 Stress Test - na ECHO - na Cardiac Cath - na  Sleep Study - na CPAP - na  Fasting Blood Sugar - na Checks Blood Sugar _____ times a day  Blood Thinner Instructions: na Aspirin Instructions:  ERAS Protcol -na   COVID TEST- 09/10/19   Anesthesia review: anes. Consult per Dr.Brooks/labs  Notified Dr. Shon Baton office of abnormal labs, hgb 8.6-gave resutls to [place]. Left message at Dr. Carlena Hurl office to fax recent labs/office note.  Patient denies shortness of breath, fever, cough and chest pain at PAT appointment   All instructions explained to the patient, with a verbal understanding of the material. Patient agrees to go over the instructions while at home for a better understanding. Patient also instructed to self quarantine after being tested for COVID-19. The opportunity to ask questions was provided.

## 2019-09-10 NOTE — Progress Notes (Signed)
[facility] [address], [place] - [address] [place] [address] Phone: [phone] Fax: [phone]      Your procedure is scheduled on  Sep 12, 2019.  Report to [facility] Main Entrance "A" at 6:30 A.M., and check in at the Admitting office.  Call this number if you have problems the morning of surgery:  [phone]  Call [phone] if you have any questions prior to your surgery date Monday-Friday 8am-4pm    Remember:  Do not eat or drink after midnight the night before your surgery    Take these medicines the morning of surgery with A SIP OF WATER:  DULoxetine (CYMBALTA) omeprazole (PRILOSEC)  gabapentin (NEURONTIN) - if needed   As of today, STOP taking any Aspirin (unless otherwise instructed by your surgeon) and Aspirin containing products, Aleve, Naproxen, Ibuprofen, Motrin, Advil, Goody's, BC's, all herbal medications, fish oil, and all vitamins.                      Do not wear jewelry, make up, or nail polish            Do not wear lotions, powders, perfumes, or deodorant.            Do not shave 48 hours prior to surgery.            Do not bring valuables to the hospital.            [facility] is not responsible for any belongings or valuables.  Do NOT Smoke (Tobacco/Vapping) or drink Alcohol 24 hours prior to your procedure If you use a CPAP at night, you may bring all equipment for your overnight stay.   Contacts, glasses, dentures or bridgework may not be worn into surgery.      For patients admitted to the hospital, discharge time will be determined by your treatment team.   Patients discharged the day of surgery will not be allowed to drive home, and someone needs to stay with them for 24 hours.    Special instructions:   [facility]- Preparing For Surgery  Before surgery, you can play an important role. Because skin is not sterile, your skin needs to be as free of germs as possible. You can reduce the number of germs on your  skin by washing with CHG (chlorahexidine gluconate) Soap before surgery.  CHG is an antiseptic cleaner which kills germs and bonds with the skin to continue killing germs even after washing.    Oral Hygiene is also important to reduce your risk of infection.  Remember - BRUSH YOUR TEETH THE MORNING OF SURGERY WITH YOUR REGULAR TOOTHPASTE  Please do not use if you have an allergy to CHG or antibacterial soaps. If your skin becomes reddened/irritated stop using the CHG.  Do not shave (including legs and underarms) for at least 48 hours prior to first CHG shower. It is OK to shave your face.  Please follow these instructions carefully.   1. Shower the NIGHT BEFORE SURGERY and the MORNING OF SURGERY with CHG Soap.   2. If you chose to wash your hair, wash your hair first as usual with your normal shampoo.  3. After you shampoo, rinse your hair and body thoroughly to remove the shampoo.  4. Use CHG as you would any other liquid soap. You can apply CHG directly to the skin and wash gently with a scrungie or a clean washcloth.   5. Apply  the CHG Soap to your body ONLY FROM THE NECK DOWN.  Do not use on open wounds or open sores. Avoid contact with your eyes, ears, mouth and genitals (private parts). Wash Face and genitals (private parts)  with your normal soap.   6. Wash thoroughly, paying special attention to the area where your surgery will be performed.  7. Thoroughly rinse your body with warm water from the neck down.  8. DO NOT shower/wash with your normal soap after using and rinsing off the CHG Soap.  9. Pat yourself dry with a CLEAN TOWEL.  10. Wear CLEAN PAJAMAS to bed the night before surgery, wear comfortable clothes the morning of surgery  11. Place CLEAN SHEETS on your bed the night of your first shower and DO NOT SLEEP WITH PETS.   Day of Surgery:   Do not apply any deodorants/lotions.  Please wear clean clothes to the hospital/surgery center.   Remember to brush your teeth  WITH YOUR REGULAR TOOTHPASTE.   Please read over the following fact sheets that you were given.

## 2019-09-11 NOTE — Anesthesia Preprocedure Evaluation (Addendum)
Anesthesia Evaluation  Patient identified by MRN, date of birth, ID band Patient awake    Reviewed: Allergy & Precautions, NPO status , Patient's Chart, lab work & pertinent test results  History of Anesthesia Complications Negative for: history of anesthetic complications  Airway Mallampati: II  TM Distance: >3 FB Neck ROM: Full    Dental  (+) Teeth Intact   Pulmonary neg pulmonary ROS, former smoker,    Pulmonary exam normal        Cardiovascular hypertension, Pt. on medications Normal cardiovascular exam     Neuro/Psych PSYCHIATRIC DISORDERS Anxiety Depression negative neurological ROS     GI/Hepatic Neg liver ROS, GERD  ,  Endo/Other  negative endocrine ROS  Renal/GU negative Renal ROS  negative genitourinary   Musculoskeletal negative musculoskeletal ROS (+)   Abdominal   Peds  Hematology  (+) Blood dyscrasia (Hgb 8.6), anemia ,   Anesthesia Other Findings   Reproductive/Obstetrics                          Anesthesia Physical Anesthesia Plan  ASA: III  Anesthesia Plan: General   Post-op Pain Management: GA combined w/ Regional for post-op pain   Induction: Intravenous  PONV Risk Score and Plan: 3 and Ondansetron, Dexamethasone, Treatment may vary due to age or medical condition and Midazolam  Airway Management Planned: Oral ETT  Additional Equipment: Arterial line  Intra-op Plan:   Post-operative Plan: Extubation in OR  Informed Consent: I have reviewed the patients History and Physical, chart, labs and discussed the procedure including the risks, benefits and alternatives for the proposed anesthesia with the patient or authorized representative who has indicated his/her understanding and acceptance.     Dental advisory given  Plan Discussed with:   Anesthesia Plan Comments: (PAT note by Antionette Poles, PA-C: Patient was last seen by her PCP Dr. Nita Sells on 08/06/2019  and she was cleared for surgery per office visit note, "preop clearance-no lung or cardiac issues-has had multiple surgeries in the last 6 months without any difficulty-no contraindication to get the surgery. Herniated disc surgery in October-doing well.  Right shoulder replacement-02/20/2020, Dr. Nelda Severe great from this."  Patient noted to be anemic on preop labs. Hemoglobin 8.6 on 09/10/2019, down from 9.9 on 05/22/2019.  This result was called to Dr. Shon Baton office, and per Cordelia Pen his surgical scheduler he reviewed the labs and wants to proceed.  He started the patient on iron and is planning to use TXA and Cell Saver intraoperatively.  Result was also called to Dr. Estanislado Spire office.  Remainder of preop labs unremarkable.   EKG 05/22/19: NSR. Rate 71.  CHEST - 2 VIEW 09/10/19:  COMPARISON:  None  FINDINGS: Normal heart size, mediastinal contours, and pulmonary vascularity.  Lungs clear.  No pulmonary infiltrate, pleural effusion or pneumothorax.  RIGHT shoulder prosthesis.  IMPRESSION: No acute abnormalities. )      Anesthesia Quick Evaluation

## 2019-09-11 NOTE — Progress Notes (Signed)
Anesthesia Chart Review:  Patient was last seen by her PCP Dr. Nita Sells on 08/06/2019 and she was cleared for surgery per office visit note, "preop clearance-no lung or cardiac issues-has had multiple surgeries in the last 6 months without any difficulty-no contraindication to get the surgery. Herniated disc surgery in October-doing well.  Right shoulder replacement-02/20/2020, Dr. Nelda Severe great from this."  Patient noted to be anemic on preop labs. Hemoglobin 8.6 on 09/10/2019, down from 9.9 on 05/22/2019.  This result was called to Dr. Shon Baton office, and per Cordelia Pen his surgical scheduler he reviewed the labs and wants to proceed.  He started the patient on iron and is planning to use TXA and Cell Saver intraoperatively.  Result was also called to Dr. Estanislado Spire office.  Remainder of preop labs unremarkable.   EKG 05/22/19: NSR. Rate 71.  CHEST - 2 VIEW 09/10/19:  COMPARISON:  None  FINDINGS: Normal heart size, mediastinal contours, and pulmonary vascularity.  Lungs clear.  No pulmonary infiltrate, pleural effusion or pneumothorax.  RIGHT shoulder prosthesis.  IMPRESSION: No acute abnormalities.    Zannie Cove [facility] Stay [facility]/Anesthesiology Phone [phone] 09/11/2019 9:54 AM

## 2019-09-12 ENCOUNTER — Inpatient Hospital Stay (HOSPITAL_COMMUNITY)
Admission: RE | Disposition: A | Discharge status: Home / Self Care | Payer: Self-pay | Source: Home / Self Care | Attending: Orthopedic Surgery

## 2019-09-12 ENCOUNTER — Other Ambulatory Visit: Payer: Self-pay

## 2019-09-12 ENCOUNTER — Inpatient Hospital Stay (HOSPITAL_COMMUNITY): Payer: Medicare Other

## 2019-09-12 ENCOUNTER — Encounter (HOSPITAL_COMMUNITY): Payer: Self-pay | Admitting: Orthopedic Surgery

## 2019-09-12 ENCOUNTER — Inpatient Hospital Stay (HOSPITAL_COMMUNITY): Payer: Medicare Other | Admitting: Certified Registered Nurse Anesthetist

## 2019-09-12 ENCOUNTER — Inpatient Hospital Stay (HOSPITAL_COMMUNITY): Payer: Medicare Other | Admitting: Physician Assistant

## 2019-09-12 ENCOUNTER — Inpatient Hospital Stay (HOSPITAL_COMMUNITY)
Admission: RE | Admit: 2019-09-12 | Discharge: 2019-09-15 | DRG: 460 | Disposition: A | Discharge status: Home Health | Payer: Medicare Other | Attending: Orthopedic Surgery | Admitting: Orthopedic Surgery

## 2019-09-12 DIAGNOSIS — I10 Essential (primary) hypertension: Secondary | ICD-10-CM | POA: Diagnosis not present

## 2019-09-12 DIAGNOSIS — Z791 Long term (current) use of non-steroidal anti-inflammatories (NSAID): Secondary | ICD-10-CM | POA: Diagnosis not present

## 2019-09-12 DIAGNOSIS — M4856XA Collapsed vertebra, not elsewhere classified, lumbar region, initial encounter for fracture: Secondary | ICD-10-CM | POA: Diagnosis not present

## 2019-09-12 DIAGNOSIS — M5116 Intervertebral disc disorders with radiculopathy, lumbar region: Secondary | ICD-10-CM | POA: Diagnosis present

## 2019-09-12 DIAGNOSIS — F329 Major depressive disorder, single episode, unspecified: Secondary | ICD-10-CM | POA: Diagnosis present

## 2019-09-12 DIAGNOSIS — M4186 Other forms of scoliosis, lumbar region: Secondary | ICD-10-CM | POA: Diagnosis present

## 2019-09-12 DIAGNOSIS — M48061 Spinal stenosis, lumbar region without neurogenic claudication: Secondary | ICD-10-CM | POA: Diagnosis not present

## 2019-09-12 DIAGNOSIS — M4326 Fusion of spine, lumbar region: Secondary | ICD-10-CM

## 2019-09-12 DIAGNOSIS — Z20822 Contact with and (suspected) exposure to covid-19: Secondary | ICD-10-CM | POA: Diagnosis present

## 2019-09-12 DIAGNOSIS — Z79899 Other long term (current) drug therapy: Secondary | ICD-10-CM | POA: Diagnosis not present

## 2019-09-12 DIAGNOSIS — D649 Anemia, unspecified: Secondary | ICD-10-CM

## 2019-09-12 DIAGNOSIS — K219 Gastro-esophageal reflux disease without esophagitis: Secondary | ICD-10-CM | POA: Diagnosis present

## 2019-09-12 DIAGNOSIS — Z87891 Personal history of nicotine dependence: Secondary | ICD-10-CM | POA: Diagnosis not present

## 2019-09-12 DIAGNOSIS — D62 Acute posthemorrhagic anemia: Secondary | ICD-10-CM | POA: Diagnosis not present

## 2019-09-12 DIAGNOSIS — F419 Anxiety disorder, unspecified: Secondary | ICD-10-CM | POA: Diagnosis present

## 2019-09-12 DIAGNOSIS — Z419 Encounter for procedure for purposes other than remedying health state, unspecified: Secondary | ICD-10-CM

## 2019-09-12 DIAGNOSIS — Z596 Low income: Secondary | ICD-10-CM | POA: Diagnosis not present

## 2019-09-12 HISTORY — PX: ANTERIOR LUMBAR FUSION: SHX1170

## 2019-09-12 HISTORY — PX: ABDOMINAL EXPOSURE: SHX5708

## 2019-09-12 LAB — CBC
HCT: 29.1 % — ABNORMAL LOW (ref 36.0–46.0)
Hemoglobin: 8.2 g/dL — ABNORMAL LOW (ref 12.0–15.0)
MCH: 21.5 pg — ABNORMAL LOW (ref 26.0–34.0)
MCHC: 28.2 g/dL — ABNORMAL LOW (ref 30.0–36.0)
MCV: 76.4 fL — ABNORMAL LOW (ref 80.0–100.0)
Platelets: 192 10*3/uL (ref 150–400)
RBC: 3.81 MIL/uL — ABNORMAL LOW (ref 3.87–5.11)
RDW: 18.6 % — ABNORMAL HIGH (ref 11.5–15.5)
WBC: 8.7 10*3/uL (ref 4.0–10.5)
nRBC: 0 % (ref 0.0–0.2)

## 2019-09-12 LAB — POCT I-STAT 7, (LYTES, BLD GAS, ICA,H+H)
Acid-base deficit: 2 mmol/L (ref 0.0–2.0)
Bicarbonate: 23 mmol/L (ref 20.0–28.0)
Calcium, Ion: 1.21 mmol/L (ref 1.15–1.40)
HCT: 24 % — ABNORMAL LOW (ref 36.0–46.0)
Hemoglobin: 8.2 g/dL — ABNORMAL LOW (ref 12.0–15.0)
O2 Saturation: 99 %
Patient temperature: 36.4
Potassium: 4 mmol/L (ref 3.5–5.1)
Sodium: 137 mmol/L (ref 135–145)
TCO2: 24 mmol/L (ref 22–32)
pCO2 arterial: 37.9 mmHg (ref 32.0–48.0)
pH, Arterial: 7.388 (ref 7.350–7.450)
pO2, Arterial: 121 mmHg — ABNORMAL HIGH (ref 83.0–108.0)

## 2019-09-12 LAB — POCT I-STAT, CHEM 8
BUN: 18 mg/dL (ref 8–23)
BUN: 16 mg/dL (ref 8–23)
Calcium, Ion: 1.25 mmol/L (ref 1.15–1.40)
Calcium, Ion: 1.17 mmol/L (ref 1.15–1.40)
Chloride: 104 mmol/L (ref 98–111)
Chloride: 107 mmol/L (ref 98–111)
Creatinine, Ser: 0.6 mg/dL (ref 0.44–1.00)
Creatinine, Ser: 0.6 mg/dL (ref 0.44–1.00)
Glucose, Bld: 138 mg/dL — ABNORMAL HIGH (ref 70–99)
Glucose, Bld: 159 mg/dL — ABNORMAL HIGH (ref 70–99)
HCT: 24 % — ABNORMAL LOW (ref 36.0–46.0)
HCT: 21 % — ABNORMAL LOW (ref 36.0–46.0)
Hemoglobin: 8.2 g/dL — ABNORMAL LOW (ref 12.0–15.0)
Hemoglobin: 7.1 g/dL — ABNORMAL LOW (ref 12.0–15.0)
Potassium: 4.2 mmol/L (ref 3.5–5.1)
Potassium: 3.8 mmol/L (ref 3.5–5.1)
Sodium: 137 mmol/L (ref 135–145)
Sodium: 139 mmol/L (ref 135–145)
TCO2: 24 mmol/L (ref 22–32)
TCO2: 22 mmol/L (ref 22–32)

## 2019-09-12 LAB — PREPARE RBC (CROSSMATCH)

## 2019-09-12 SURGERY — ANTERIOR LUMBAR FUSION 1 LEVEL
Anesthesia: General | Site: Spine Lumbar

## 2019-09-12 MED ORDER — DULOXETINE HCL 30 MG PO CPEP
60.0000 mg | ORAL_CAPSULE | Freq: Every day | ORAL | Status: DC
  Administered 2019-09-14: 60 mg via ORAL
  Filled 2019-09-12: qty 2
  Filled 2019-09-12: qty 1

## 2019-09-12 MED ORDER — DEXAMETHASONE SODIUM PHOSPHATE 10 MG/ML IJ SOLN
INTRAMUSCULAR | Status: AC
  Filled 2019-09-12: qty 1

## 2019-09-12 MED ORDER — ACETAMINOPHEN 325 MG PO TABS
650.0000 mg | ORAL_TABLET | ORAL | Status: DC | PRN
  Administered 2019-09-14 (×3): 650 mg via ORAL
  Filled 2019-09-12 (×3): qty 2

## 2019-09-12 MED ORDER — LISINOPRIL 20 MG PO TABS
20.0000 mg | ORAL_TABLET | Freq: Every day | ORAL | Status: DC
  Filled 2019-09-12: qty 1

## 2019-09-12 MED ORDER — BUPIVACAINE HCL (PF) 0.25 % IJ SOLN
INTRAMUSCULAR | Status: AC
  Filled 2019-09-12: qty 30

## 2019-09-12 MED ORDER — ACETAMINOPHEN 650 MG RE SUPP: 650.0000 mg | RECTAL | Status: DC | PRN

## 2019-09-12 MED ORDER — OXYCODONE HCL 5 MG/5ML PO SOLN: 5.0000 mg | Freq: Once | ORAL | Status: AC | PRN

## 2019-09-12 MED ORDER — PROPOFOL 10 MG/ML IV BOLUS
INTRAVENOUS | Status: DC | PRN
  Administered 2019-09-12: 150 mg via INTRAVENOUS
  Administered 2019-09-12: 20 mg via INTRAVENOUS

## 2019-09-12 MED ORDER — ONDANSETRON HCL 4 MG/2ML IJ SOLN: 4.0000 mg | Freq: Once | INTRAMUSCULAR | Status: DC | PRN

## 2019-09-12 MED ORDER — HYDROCHLOROTHIAZIDE 12.5 MG PO CAPS
12.5000 mg | ORAL_CAPSULE | Freq: Every day | ORAL | Status: DC
  Filled 2019-09-12: qty 1

## 2019-09-12 MED ORDER — MIDAZOLAM HCL 2 MG/2ML IJ SOLN
INTRAMUSCULAR | Status: DC | PRN
  Administered 2019-09-12: 2 mg via INTRAVENOUS

## 2019-09-12 MED ORDER — OXYCODONE HCL 5 MG PO TABS
10.0000 mg | ORAL_TABLET | ORAL | Status: DC | PRN
  Administered 2019-09-13 – 2019-09-15 (×13): 10 mg via ORAL
  Filled 2019-09-12 (×13): qty 2

## 2019-09-12 MED ORDER — HYDROMORPHONE HCL 1 MG/ML IJ SOLN
1.0000 mg | INTRAMUSCULAR | Status: DC | PRN
  Administered 2019-09-12 – 2019-09-13 (×5): 1 mg via INTRAVENOUS
  Filled 2019-09-12 (×6): qty 1

## 2019-09-12 MED ORDER — HEMOSTATIC AGENTS (NO CHARGE) OPTIME
TOPICAL | Status: DC | PRN
  Administered 2019-09-12 (×2): 1
  Administered 2019-09-12: 2
  Administered 2019-09-12: 1

## 2019-09-12 MED ORDER — ROCURONIUM BROMIDE 10 MG/ML (PF) SYRINGE
PREFILLED_SYRINGE | INTRAVENOUS | Status: AC
  Filled 2019-09-12: qty 20

## 2019-09-12 MED ORDER — PROPOFOL 500 MG/50ML IV EMUL
INTRAVENOUS | Status: DC | PRN
  Administered 2019-09-12: 50 ug/kg/min via INTRAVENOUS

## 2019-09-12 MED ORDER — ROCURONIUM BROMIDE 10 MG/ML (PF) SYRINGE
PREFILLED_SYRINGE | INTRAVENOUS | Status: DC | PRN
  Administered 2019-09-12 (×2): 50 mg via INTRAVENOUS
  Administered 2019-09-12: 30 mg via INTRAVENOUS
  Administered 2019-09-12: 70 mg via INTRAVENOUS

## 2019-09-12 MED ORDER — PHENOL 1.4 % MT LIQD: 1.0000 | OROMUCOSAL | Status: DC | PRN

## 2019-09-12 MED ORDER — FENTANYL CITRATE (PF) 250 MCG/5ML IJ SOLN
INTRAMUSCULAR | Status: DC | PRN
  Administered 2019-09-12 (×4): 50 ug via INTRAVENOUS
  Administered 2019-09-12: 100 ug via INTRAVENOUS
  Administered 2019-09-12 (×2): 25 ug via INTRAVENOUS
  Administered 2019-09-12: 50 ug via INTRAVENOUS
  Administered 2019-09-12 (×2): 25 ug via INTRAVENOUS
  Administered 2019-09-12: 50 ug via INTRAVENOUS

## 2019-09-12 MED ORDER — TRANEXAMIC ACID 1000 MG/10ML IV SOLN
INTRAVENOUS | Status: DC | PRN
  Administered 2019-09-12: 1000 mg via INTRAVENOUS

## 2019-09-12 MED ORDER — CHLORHEXIDINE GLUCONATE 4 % EX LIQD: 60.0000 mL | Freq: Once | CUTANEOUS | Status: DC

## 2019-09-12 MED ORDER — SUGAMMADEX SODIUM 200 MG/2ML IV SOLN
INTRAVENOUS | Status: DC | PRN
  Administered 2019-09-12: 200 mg via INTRAVENOUS

## 2019-09-12 MED ORDER — FLEET ENEMA 7-19 GM/118ML RE ENEM: 1.0000 | ENEMA | Freq: Once | RECTAL | Status: DC | PRN

## 2019-09-12 MED ORDER — ALBUMIN HUMAN 5 % IV SOLN
INTRAVENOUS | Status: DC | PRN
Start: 2019-09-12 — End: 2019-09-12

## 2019-09-12 MED ORDER — PHENYLEPHRINE 40 MCG/ML (10ML) SYRINGE FOR IV PUSH (FOR BLOOD PRESSURE SUPPORT)
PREFILLED_SYRINGE | INTRAVENOUS | Status: DC | PRN
  Administered 2019-09-12 (×2): 40 ug via INTRAVENOUS

## 2019-09-12 MED ORDER — LIDOCAINE 2% (20 MG/ML) 5 ML SYRINGE
INTRAMUSCULAR | Status: AC
  Filled 2019-09-12: qty 5

## 2019-09-12 MED ORDER — SODIUM CHLORIDE 0.9 % IV SOLN: 250.0000 mL | INTRAVENOUS | Status: DC

## 2019-09-12 MED ORDER — PHENYLEPHRINE HCL-NACL 10-0.9 MG/250ML-% IV SOLN
INTRAVENOUS | Status: DC | PRN
  Administered 2019-09-12: 25 ug/min via INTRAVENOUS

## 2019-09-12 MED ORDER — LACTATED RINGERS IV SOLN: INTRAVENOUS | Status: DC | PRN

## 2019-09-12 MED ORDER — THROMBIN (RECOMBINANT) 20000 UNITS EX SOLR
CUTANEOUS | Status: AC
  Filled 2019-09-12: qty 20000

## 2019-09-12 MED ORDER — CEFAZOLIN SODIUM-DEXTROSE 2-4 GM/100ML-% IV SOLN
2.0000 g | INTRAVENOUS | Status: AC
  Administered 2019-09-12 (×2): 2 g via INTRAVENOUS
  Filled 2019-09-12: qty 100

## 2019-09-12 MED ORDER — FENTANYL CITRATE (PF) 100 MCG/2ML IJ SOLN
INTRAMUSCULAR | Status: AC
  Filled 2019-09-12: qty 2

## 2019-09-12 MED ORDER — FENTANYL CITRATE (PF) 250 MCG/5ML IJ SOLN
INTRAMUSCULAR | Status: AC
  Filled 2019-09-12: qty 5

## 2019-09-12 MED ORDER — OXYCODONE HCL 5 MG PO TABS
5.0000 mg | ORAL_TABLET | ORAL | Status: DC | PRN
  Administered 2019-09-12: 21:00:00 5 mg via ORAL
  Filled 2019-09-12: qty 1

## 2019-09-12 MED ORDER — PROPOFOL 10 MG/ML IV BOLUS
INTRAVENOUS | Status: AC
  Filled 2019-09-12: qty 40

## 2019-09-12 MED ORDER — EPINEPHRINE PF 1 MG/ML IJ SOLN
INTRAMUSCULAR | Status: DC | PRN
Start: 2019-09-12 — End: 2019-09-12
  Administered 2019-09-12: .15 mL

## 2019-09-12 MED ORDER — SODIUM CHLORIDE 0.9 % IV SOLN: INTRAVENOUS | Status: DC | PRN

## 2019-09-12 MED ORDER — TRANEXAMIC ACID-NACL 1000-0.7 MG/100ML-% IV SOLN
INTRAVENOUS | Status: AC
  Filled 2019-09-12: qty 100

## 2019-09-12 MED ORDER — GABAPENTIN 300 MG PO CAPS
300.0000 mg | ORAL_CAPSULE | Freq: Every day | ORAL | Status: DC | PRN
  Administered 2019-09-12: 600 mg via ORAL
  Filled 2019-09-12: qty 2

## 2019-09-12 MED ORDER — DOCUSATE SODIUM 100 MG PO CAPS
100.0000 mg | ORAL_CAPSULE | Freq: Two times a day (BID) | ORAL | Status: DC
  Administered 2019-09-12 – 2019-09-14 (×4): 100 mg via ORAL
  Filled 2019-09-12 (×4): qty 1

## 2019-09-12 MED ORDER — SODIUM CHLORIDE 0.9% FLUSH: 3.0000 mL | INTRAVENOUS | Status: DC | PRN

## 2019-09-12 MED ORDER — THROMBIN 20000 UNITS EX SOLR: CUTANEOUS | Status: DC | PRN

## 2019-09-12 MED ORDER — OXYCODONE HCL 5 MG PO TABS
5.0000 mg | ORAL_TABLET | Freq: Once | ORAL | Status: AC | PRN
  Administered 2019-09-12: 18:00:00 5 mg via ORAL

## 2019-09-12 MED ORDER — OXYCODONE HCL 5 MG PO TABS
ORAL_TABLET | ORAL | Status: AC
  Filled 2019-09-12: qty 1

## 2019-09-12 MED ORDER — BUPIVACAINE LIPOSOME 1.3 % IJ SUSP
INTRAMUSCULAR | Status: DC | PRN
  Administered 2019-09-12: 10 mL via PERINEURAL

## 2019-09-12 MED ORDER — CEFAZOLIN SODIUM-DEXTROSE 1-4 GM/50ML-% IV SOLN
1.0000 g | Freq: Three times a day (TID) | INTRAVENOUS | Status: AC
  Administered 2019-09-12 – 2019-09-13 (×2): 1 g via INTRAVENOUS
  Filled 2019-09-12 (×2): qty 50

## 2019-09-12 MED ORDER — POLYETHYLENE GLYCOL 3350 17 G PO PACK
17.0000 g | PACK | Freq: Every day | ORAL | Status: DC | PRN
  Administered 2019-09-13: 17 g via ORAL
  Filled 2019-09-12: qty 1

## 2019-09-12 MED ORDER — LIDOCAINE 2% (20 MG/ML) 5 ML SYRINGE
INTRAMUSCULAR | Status: DC | PRN
  Administered 2019-09-12: 40 mg via INTRAVENOUS

## 2019-09-12 MED ORDER — MORPHINE SULFATE (PF) 2 MG/ML IV SOLN: 2.0000 mg | INTRAVENOUS | Status: DC | PRN

## 2019-09-12 MED ORDER — EPINEPHRINE PF 1 MG/ML IJ SOLN
INTRAMUSCULAR | Status: AC
  Filled 2019-09-12: qty 1

## 2019-09-12 MED ORDER — 0.9 % SODIUM CHLORIDE (POUR BTL) OPTIME
TOPICAL | Status: DC | PRN
  Administered 2019-09-12 (×5): 1000 mL

## 2019-09-12 MED ORDER — ONDANSETRON HCL 4 MG/2ML IJ SOLN
INTRAMUSCULAR | Status: DC | PRN
  Administered 2019-09-12: 4 mg via INTRAVENOUS

## 2019-09-12 MED ORDER — METHOCARBAMOL 500 MG PO TABS
ORAL_TABLET | ORAL | Status: AC
  Filled 2019-09-12: qty 1

## 2019-09-12 MED ORDER — LISINOPRIL-HYDROCHLOROTHIAZIDE 20-12.5 MG PO TABS: 1.0000 | ORAL_TABLET | Freq: Every day | ORAL | Status: DC

## 2019-09-12 MED ORDER — FENTANYL CITRATE (PF) 100 MCG/2ML IJ SOLN
25.0000 ug | INTRAMUSCULAR | Status: DC | PRN
  Administered 2019-09-12 (×3): 25 ug via INTRAVENOUS

## 2019-09-12 MED ORDER — PHENYLEPHRINE HCL (PRESSORS) 10 MG/ML IV SOLN
INTRAVENOUS | Status: AC
  Filled 2019-09-12: qty 1

## 2019-09-12 MED ORDER — LACTATED RINGERS IV SOLN: INTRAVENOUS | Status: DC

## 2019-09-12 MED ORDER — METHOCARBAMOL 1000 MG/10ML IJ SOLN
500.0000 mg | Freq: Four times a day (QID) | INTRAVENOUS | Status: DC | PRN
  Filled 2019-09-12: qty 5

## 2019-09-12 MED ORDER — BUPIVACAINE HCL 0.25 % IJ SOLN
INTRAMUSCULAR | Status: DC | PRN
  Administered 2019-09-12: 10 mL

## 2019-09-12 MED ORDER — MIDAZOLAM HCL 2 MG/2ML IJ SOLN
INTRAMUSCULAR | Status: AC
  Filled 2019-09-12: qty 2

## 2019-09-12 MED ORDER — SODIUM CHLORIDE 0.9% FLUSH
3.0000 mL | Freq: Two times a day (BID) | INTRAVENOUS | Status: DC
  Administered 2019-09-12 – 2019-09-13 (×2): 3 mL via INTRAVENOUS

## 2019-09-12 MED ORDER — ONDANSETRON HCL 4 MG PO TABS: 4.0000 mg | ORAL_TABLET | Freq: Four times a day (QID) | ORAL | Status: DC | PRN

## 2019-09-12 MED ORDER — BUPIVACAINE HCL (PF) 0.5 % IJ SOLN
INTRAMUSCULAR | Status: DC | PRN
  Administered 2019-09-12: 15 mL via PERINEURAL

## 2019-09-12 MED ORDER — SODIUM CHLORIDE 0.9% IV SOLUTION: Freq: Once | INTRAVENOUS | Status: AC

## 2019-09-12 MED ORDER — MENTHOL 3 MG MT LOZG: 1.0000 | LOZENGE | OROMUCOSAL | Status: DC | PRN

## 2019-09-12 MED ORDER — DEXAMETHASONE SODIUM PHOSPHATE 10 MG/ML IJ SOLN
INTRAMUSCULAR | Status: DC | PRN
  Administered 2019-09-12: 10 mg via INTRAVENOUS

## 2019-09-12 MED ORDER — ONDANSETRON HCL 4 MG/2ML IJ SOLN
INTRAMUSCULAR | Status: AC
  Filled 2019-09-12: qty 2

## 2019-09-12 MED ORDER — ONDANSETRON HCL 4 MG/2ML IJ SOLN: 4.0000 mg | Freq: Four times a day (QID) | INTRAMUSCULAR | Status: DC | PRN

## 2019-09-12 MED ORDER — METHOCARBAMOL 500 MG PO TABS
500.0000 mg | ORAL_TABLET | Freq: Four times a day (QID) | ORAL | Status: DC | PRN
  Administered 2019-09-12 – 2019-09-15 (×8): 500 mg via ORAL
  Filled 2019-09-12 (×7): qty 1

## 2019-09-12 SURGICAL SUPPLY — 115 items
APPLIER CLIP 11 MED OPEN (CLIP) ×4
BLADE CLIPPER SURG (BLADE) ×2 IMPLANT
BLADE SURG 10 STRL SS (BLADE) ×4 IMPLANT
BONE VIVIGEN FORMABLE 10CC (Bone Implant) ×4 IMPLANT
BONE VIVIGEN FORMABLE 5.4CC (Bone Implant) ×4 IMPLANT
CABLE BIPOLOR RESECTION CORD (MISCELLANEOUS) ×4 IMPLANT
CAGE MODULUS XL 12X18X55 - 10 (Cage) ×2 IMPLANT
CAGE MODULUS XL 12X18X60 - 10 (Cage) ×2 IMPLANT
CAGE NANOLOCK LG 14 12D (Cage) ×1 IMPLANT
CAGE NANOLOCK LG 14MM 12D (Cage) ×1 IMPLANT
CATH FOLEY 2WAY SLVR  5CC 16FR (CATHETERS) ×4
CATH FOLEY 2WAY SLVR 5CC 16FR (CATHETERS) ×2 IMPLANT
CLIP APPLIE 11 MED OPEN (CLIP) ×4 IMPLANT
CLIP VESOCCLUDE MED 24/CT (CLIP) ×4 IMPLANT
CLIP VESOCCLUDE SM WIDE 24/CT (CLIP) ×4 IMPLANT
CLOSURE STERI-STRIP 1/2X4 (GAUZE/BANDAGES/DRESSINGS) ×2
CLSR STERI-STRIP ANTIMIC 1/2X4 (GAUZE/BANDAGES/DRESSINGS) ×2 IMPLANT
COVER BACK TABLE 60X90IN (DRAPES) ×4 IMPLANT
COVER SURGICAL LIGHT HANDLE (MISCELLANEOUS) ×6 IMPLANT
COVER WAND RF STERILE (DRAPES) ×8 IMPLANT
DRAPE C-ARM 42X72 X-RAY (DRAPES) ×8 IMPLANT
DRAPE C-ARMOR (DRAPES) ×6 IMPLANT
DRAPE INCISE IOBAN 66X45 STRL (DRAPES) ×6 IMPLANT
DRAPE POUCH INSTRU U-SHP 10X18 (DRAPES) ×6 IMPLANT
DRAPE SURG 17X23 STRL (DRAPES) ×4 IMPLANT
DRAPE U-SHAPE 47X51 STRL (DRAPES) ×4 IMPLANT
DRSG OPSITE POSTOP 3X4 (GAUZE/BANDAGES/DRESSINGS) ×2 IMPLANT
DRSG OPSITE POSTOP 4X6 (GAUZE/BANDAGES/DRESSINGS) ×2 IMPLANT
DRSG OPSITE POSTOP 4X8 (GAUZE/BANDAGES/DRESSINGS) ×4 IMPLANT
DURAPREP 26ML APPLICATOR (WOUND CARE) ×4 IMPLANT
ELECT BLADE 4.0 EZ CLEAN MEGAD (MISCELLANEOUS) ×4
ELECT CAUTERY BLADE 6.4 (BLADE) ×4 IMPLANT
ELECT PENCIL ROCKER SW 15FT (MISCELLANEOUS) ×4 IMPLANT
ELECT REM PT RETURN 9FT ADLT (ELECTROSURGICAL) ×8
ELECTRODE BLDE 4.0 EZ CLN MEGD (MISCELLANEOUS) ×2 IMPLANT
ELECTRODE REM PT RTRN 9FT ADLT (ELECTROSURGICAL) ×2 IMPLANT
FELT TEFLON 1X6 (MISCELLANEOUS) ×2 IMPLANT
GAUZE 4X4 16PLY RFD (DISPOSABLE) IMPLANT
GLOVE BIO SURGEON STRL SZ 6.5 (GLOVE) ×4 IMPLANT
GLOVE BIO SURGEON STRL SZ7.5 (GLOVE) IMPLANT
GLOVE BIO SURGEONS STRL SZ 6.5 (GLOVE) ×2
GLOVE BIOGEL PI IND STRL 6.5 (GLOVE) ×2 IMPLANT
GLOVE BIOGEL PI IND STRL 7.5 (GLOVE) ×2 IMPLANT
GLOVE BIOGEL PI IND STRL 8.5 (GLOVE) ×4 IMPLANT
GLOVE BIOGEL PI INDICATOR 6.5 (GLOVE) ×2
GLOVE BIOGEL PI INDICATOR 7.5 (GLOVE) ×2
GLOVE BIOGEL PI INDICATOR 8.5 (GLOVE) ×4
GLOVE SS BIOGEL STRL SZ 8.5 (GLOVE) ×4 IMPLANT
GLOVE SUPERSENSE BIOGEL SZ 8.5 (GLOVE) ×4
GLOVE SURG SS PI 7.5 STRL IVOR (GLOVE) ×4 IMPLANT
GOWN STRL REUS W/ TWL LRG LVL3 (GOWN DISPOSABLE) ×6 IMPLANT
GOWN STRL REUS W/ TWL XL LVL3 (GOWN DISPOSABLE) ×2 IMPLANT
GOWN STRL REUS W/TWL 2XL LVL3 (GOWN DISPOSABLE) ×12 IMPLANT
GOWN STRL REUS W/TWL LRG LVL3 (GOWN DISPOSABLE) ×12
GOWN STRL REUS W/TWL XL LVL3 (GOWN DISPOSABLE) ×8
GRAFT BNE MATRIX VG FRMBL L 10 (Bone Implant) IMPLANT
GRAFT BNE MATRIX VG FRMBL MD 5 (Bone Implant) IMPLANT
KIT BASIN OR (CUSTOM PROCEDURE TRAY) ×4 IMPLANT
KIT DILATOR XLIF 5 (KITS) IMPLANT
KIT SURGICAL ACCESS MAXCESS 4 (KITS) ×2 IMPLANT
KIT TURNOVER KIT B (KITS) ×8 IMPLANT
KIT XLIF (KITS) ×2
LOOP VESSEL MINI RED (MISCELLANEOUS) ×4 IMPLANT
MODULE EMG NDL SSEP NVM5 (NEEDLE) IMPLANT
MODULE EMG NEEDLE SSEP NVM5 (NEEDLE) ×4 IMPLANT
MODULE NVM5 NEXT GEN EMG (NEEDLE) ×2 IMPLANT
NDL SPNL 18GX3.5 QUINCKE PK (NEEDLE) ×2 IMPLANT
NEEDLE SPNL 18GX3.5 QUINCKE PK (NEEDLE) ×4 IMPLANT
NS IRRIG 1000ML POUR BTL (IV SOLUTION) ×12 IMPLANT
PACK LAMINECTOMY ORTHO (CUSTOM PROCEDURE TRAY) ×4 IMPLANT
PACK UNIVERSAL I (CUSTOM PROCEDURE TRAY) ×6 IMPLANT
PAD ARMBOARD 7.5X6 YLW CONV (MISCELLANEOUS) ×16 IMPLANT
PUTTY DBX 2.5CC (Putty) ×4 IMPLANT
PUTTY DBX 2.5CC DEPUY (Putty) IMPLANT
SCREW 6.5X25 (Screw) ×2 IMPLANT
SPONGE INTESTINAL PEANUT (DISPOSABLE) ×12 IMPLANT
SPONGE LAP 18X18 RF (DISPOSABLE) ×2 IMPLANT
SPONGE LAP 4X18 RFD (DISPOSABLE) ×2 IMPLANT
SPONGE SURGIFOAM ABS GEL 100 (HEMOSTASIS) IMPLANT
SURGIFLO W/THROMBIN 8M KIT (HEMOSTASIS) ×12 IMPLANT
SUT BONE WAX W31G (SUTURE) ×4 IMPLANT
SUT MNCRL AB 3-0 PS2 27 (SUTURE) ×4 IMPLANT
SUT MNCRL AB 4-0 PS2 18 (SUTURE) ×4 IMPLANT
SUT MON AB 3-0 SH 27 (SUTURE) ×8
SUT MON AB 3-0 SH27 (SUTURE) IMPLANT
SUT PDS AB 1 CTX 36 (SUTURE) ×8 IMPLANT
SUT PROLENE 4 0 RB 1 (SUTURE) ×12
SUT PROLENE 4-0 RB1 .5 CRCL 36 (SUTURE) ×8 IMPLANT
SUT PROLENE 5 0 C 1 24 (SUTURE) IMPLANT
SUT PROLENE 5 0 C1 (SUTURE) ×2 IMPLANT
SUT PROLENE 5 0 CC1 (SUTURE) IMPLANT
SUT PROLENE 6 0 C 1 30 (SUTURE) ×4 IMPLANT
SUT PROLENE 6 0 CC (SUTURE) IMPLANT
SUT SILK 0 TIES 10X30 (SUTURE) ×4 IMPLANT
SUT SILK 2 0 TIES 10X30 (SUTURE) ×8 IMPLANT
SUT SILK 2 0SH CR/8 30 (SUTURE) IMPLANT
SUT SILK 3 0 TIES 10X30 (SUTURE) ×8 IMPLANT
SUT SILK 3 0SH CR/8 30 (SUTURE) IMPLANT
SUT VIC AB 0 CT1 27 (SUTURE) ×4
SUT VIC AB 0 CT1 27XBRD ANBCTR (SUTURE) ×2 IMPLANT
SUT VIC AB 1 CT1 18XCR BRD 8 (SUTURE) IMPLANT
SUT VIC AB 1 CT1 27 (SUTURE) ×8
SUT VIC AB 1 CT1 27XBRD ANBCTR (SUTURE) ×4 IMPLANT
SUT VIC AB 1 CT1 8-18 (SUTURE) ×4
SUT VIC AB 2-0 CT1 18 (SUTURE) ×8 IMPLANT
SUT VIC AB 2-0 CT1 27 (SUTURE) ×4
SUT VIC AB 2-0 CT1 TAPERPNT 27 (SUTURE) ×2 IMPLANT
SUT VIC AB 3-0 SH 27 (SUTURE) ×4
SUT VIC AB 3-0 SH 27X BRD (SUTURE) ×2 IMPLANT
SYR BULB IRRIG 60ML STRL (SYRINGE) ×4 IMPLANT
SYR CONTROL 10ML LL (SYRINGE) ×2 IMPLANT
TOWEL GREEN STERILE (TOWEL DISPOSABLE) ×8 IMPLANT
TOWEL GREEN STERILE FF (TOWEL DISPOSABLE) ×4 IMPLANT
TRAP SPECIMEN MUCUS 40CC (MISCELLANEOUS) ×4 IMPLANT
WATER STERILE IRR 1000ML POUR (IV SOLUTION) ×4 IMPLANT

## 2019-09-12 NOTE — Op Note (Signed)
Operative report  Preoperative diagnosis: Degenerative lumbar spinal stenosis, degenerative lumbar disc disease with recurrent spinal stenosis.  Prior lumbar decompression.  Postoperative diagnosis: Same  Operative procedure: Anterior lumbar interbody fusion L5-S1.  Lateral interbody fusion L3-5.  Approach surgeon: Dr. Durene Cal  First Assistant: Glynis Smiles, PA  Neuro monitoring: No adverse free running EMG activity, normal SSEP with no changes.  Instrumentation:  L5-S1: Titan anterior interbody cage 14 x 36 x 24, 12 degree lordosis.  Single locking screw into the body     of S1 25 mm length.    L3-4:   Nu[employer] 3D printed titanium cage.  12 x 18 x 55.  10 degree lordosis    L4-5:  [employer] 3D printed titanium cage.  12 x 18 x 16 mm length.  10 degree lordosis  Allograft:  Vivogen.  DBX  Cell Saver:  320cc;  transfused 1 unit PRBCs  EBL: 1200 cc  Indications: Terri Alvarado is a very pleasant 64 year old man with severe onset of back buttock and radicular/neuropathic leg pain.  Despite appropriate conservative care her quality of life is continued to do deteriorate.  As a result of her back and radicular leg pain we elected to move forward with surgery.  All appropriate risks benefits and alternatives were discussed with the patient and consent was obtained.  Due to the extensive nature of the surgery we elected to do this over a 2-day..  Patient would have the anterior interbody fusion today and then tomorrow we would do the posterior supplemental fixation along with the L2 kyphoplasty for her fracture.  Operative report: Patient was brought the operating room placed upon the operating room table.  After successful induction of general anesthesia and endotracheal ovation teds SCDs and a Foley were inserted.  The anterior abdomen was prepped and draped in a standard fashion.  Timeout was taken to confirm patient procedure and all other important data.  Once we confirmed the incision site  using fluoroscopy Dr. Myra Gianotti then performed a standard anterior retroperitoneal approach to the spine.  Please refer to his dictation for specifics on the approach.  Once the retractors were placed I then scrubbed back into the case and move forward with the anterior lumbar interbody fusion.  I confirmed the appropriate level as L5-S1 with lateral fluoroscopy and then performed an annulotomy with a 10 blade scalpel.  Using a Cobb elevator I released the disc material and then removed with a combination of pituitary rongeurs curettes and Kerrison rongeurs once I remove the bulk of the disc material I then placed distracting plugs into the disc space distracted and then continue to work posteriorly.  Using finer curettes I released and remove the cartilaginous endplate and disc material.  Using a 2 mm Kerrison rongeur I resected the posterior osteophyte from the vertebral body of L1 5 and S1.  I then used a small curved curette to completely release the annulus from behind the body of L5 and S1.  At this point under live fluoroscopy I placed a lamina spreader in the intervertebral space and open and close a lamina spreader confirming I had parallel endplate distraction.  At this point I was quite pleased with the decompression/discectomy.  I completely removed all the osteophyte posteriorly and restored normal disc space height.  This provided excellent indirect foraminal and lateral recess decompression.  The implant was obtained and packed with the allograft bone.  It was then [facility] to the appropriate depth.  Once this was confirmed I used an awl  to score the cortex and I placed a single 25 mm 6.5 diameter screw through the cage and into the S1 vertebral body.  This would serve as an antikick plate to prevent inadvertent migration.  At this point I irrigated the wound copiously with normal saline and confirmed I had hemostasis.  I then remove the retractor sequentially and then closed the fascia of the rectus  with a running #1 PDS suture.  I then placed a deep running 0 Vicryl suture, then a layer of 2-0 Vicryl suture, and the skin was closed with 3-0 Monocryl.  A dry dressing was then applied.  An intraoperative abdominal x-ray was taken to confirm there were no retained surgical implants/instruments in the wound.  Once this was read and confirmed by the radiologist we removed all of the drapes and then repositioned the patient in the lateral decubitus position.  The patient was rotated to the lateral position left side up and an axillary roll was placed and the knee and ankle were well-padded as was the elbow.  With all bony prominences well-padded I then secured the left arm in the well arm holder.  Using fluoroscopy I adjusted the patient's positioning so that we had a perfect parallel view in both planes.  The patient was then secured directly to the table with tape to prevent migration during the case.  The flank was then prepped and draped in a standard fashion.  A timeout was taken again to confirm that we are moving onto the L3-4, L4-5 lateral interbody fusion.  Patient was redosed with antibiotics and we move forward with the surgery.  Using lateral fluoroscopy identified the anterior and posterior borders of the L4-5 disc space and marked out on the skin surface I then infiltrated with quarter percent Marcaine and made incision sharply dissected down to the fascia of the external oblique.  A small secondary incision made 1 fingerbreadth posteriorly was made and I bluntly dissected down to the posterior aspect of the retroperitoneal space I then entered into the retroperitoneal space and began bluntly dissecting over the psoas.  And dissected through the undersurface of the oblique muscle until I could see my finger in the lateral incision.  The first dilating tube was then advanced down to the surface of the psoas and I stimulated to ensure I was not traumatizing the plexus.  With the dilating tube directly  over the anterior third of the disc space I advanced it through the psoas down to the lateral aspect of the disc space.  It was secured with a guidepin and then I circumferentially stimulated to confirm I was not traumatizing the plexus.  I then dilated with the second and third tubes each time stimulating circumferentially to confirm satisfactory position.  The working retractor was then placed and I removed the dilating tubes.  I gently pulled the retractor posteriorly to maintain a thick cuff of muscle between the posterior aspect of the plate and the lumbar plexus.  Once my retractor was in the posterior third of the disc space I then secured it.  I stimulated behind each of the blades to confirm there was no trauma to the plexus and there is no adverse EMG activity.  The posterior blade was secured into the disc space and I could now completely visualize the lateral aspect of the L4-5 disc space.  An annulotomy was then performed with a 10 blade scalpel and then I used a Cobb elevator to go across the disc space and released  the contralateral annulus.  I then used pituitary rongeurs as well as a box osteotome to remove the bulk of the disc material.  I then used trialing implants to gently distract the disc space.  Once I was at the 12 lordotic spacer I felt as though I had excellent indirect foraminal decompression and restoration of normal disc space height.  I remove the trial implant and then used the curettes to remove the remaining portion of this/cartilaginous endplate to expose the bleeding subchondral bone.  Once this was complete I then obtained the appropriate sized cage and packed it with the graft.  It was then [facility] across the disc space to the contralateral side.  It had excellent purchase and was well seated in both the AP and lateral planes.  With the cage in place I then remove the trocar from the disc space and remove the retracting device.  I made sure there was hemostasis within the  substance of the psoas as I was removing the retractor.  Using the same incision I repositioned my dilator over the L3-4 disc space.  Using the same exact technique that I used at L4-5 I dilated up at the anterior third of the disc space at L3-4 stimulating with each dilator and then placing the working trocar.  I then pulled my retractor posteriorly keeping a adequate cuff of muscle between blade and this lumbar plexus.  Once I confirmed satisfactory positioning of the posterior blade I secured it into the disc space with a trocar.  I again stimulated circumferentially and then opened the retractor to completely expose the lateral surface of the disc space.  Annulotomy was performed and I remove the disc material.  I again used the Cobb elevator as well as a box osteotome.  I then used the trial implants to gradually distract the space.  I then trialed with the appropriate size implants and elected to use the size 12 lordotic spacer.  This provided excellent restoration of disc space.  The implant was obtained and packed with the allograft.  I rasped the endplates and made sure had bleeding subchondral bone.  I then impacted the cage across the disc space.  Final x-rays demonstrate satisfactory position in both planes.  I then used FloSeal to aid in th hemostasis of the psoas.  After final irrigation I inspected the psoas and there was no active bleeding.  The retractor was removed and I closed the fascia of the external oblique with interrupted #1 Vicryl suture  Wound was then closed in a layered fashion with 2-0 Vicryl suture, and a 3-0 Monocryl.  The small posterior wound was closed in a similar fashion.  At this point Steri-Strips and a dry dressing were applied to all 3 wounds.  The patient was ultimately extubated transfer the PACU without incident.  The end of the case all needle sponge counts were correct.  There were no adverse intraoperative events.

## 2019-09-12 NOTE — Anesthesia Preprocedure Evaluation (Addendum)
Anesthesia Evaluation  Patient identified by MRN, date of birth, ID band Patient awake    Reviewed: Allergy & Precautions, NPO status , Patient's Chart, lab work & pertinent test results  History of Anesthesia Complications Negative for: history of anesthetic complications  Airway Mallampati: II  TM Distance: >3 FB Neck ROM: Full    Dental  (+) Edentulous Upper, Edentulous Lower   Pulmonary former smoker,  09/10/2019 SARS coronavirus NEG   breath sounds clear to auscultation       Cardiovascular hypertension, Pt. on medications (-) angina Rhythm:Regular Rate:Normal     Neuro/Psych Anxiety Depression Back pain    GI/Hepatic Neg liver ROS, GERD  Medicated and Controlled,  Endo/Other  negative endocrine ROS  Renal/GU negative Renal ROS     Musculoskeletal  (+) Arthritis , scoliosis   Abdominal   Peds  Hematology  (+) Blood dyscrasia (Hb 7.1), anemia ,   Anesthesia Other Findings   Reproductive/Obstetrics                            Anesthesia Physical Anesthesia Plan  ASA: III  Anesthesia Plan: General   Post-op Pain Management:    Induction: Intravenous  PONV Risk Score and Plan: 3 and Ondansetron, Dexamethasone and Treatment may vary due to age or medical condition  Airway Management Planned: Oral ETT  Additional Equipment:   Intra-op Plan:   Post-operative Plan: Extubation in OR  Informed Consent: I have reviewed the patients History and Physical, chart, labs and discussed the procedure including the risks, benefits and alternatives for the proposed anesthesia with the patient or authorized representative who has indicated his/her understanding and acceptance.       Plan Discussed with: CRNA and Surgeon  Anesthesia Plan Comments:         Anesthesia Quick Evaluation

## 2019-09-12 NOTE — Brief Op Note (Signed)
09/12/2019  3:39 PM  PATIENT:  Terri Alvarado  64 y.o. female  PRE-OPERATIVE DIAGNOSIS:  Degeneratie scoliosis, failed back syndrome with radiculopathy L3-S1, L2 Acute compression fracture  POST-OPERATIVE DIAGNOSIS:  Degeneratie scoliosis, failed back syndrome with radiculopathy L3-S1, L2 Acute compression fracture  PROCEDURE:  Procedure(s) with comments: ANTERIOR LUMBAR FUSION LUMBAR FIVE-SACRAL ONE  ANTERIOR LATERAL LUMBAR FUSION LUMBAR THREE-LUMBAR FIVE (N/A) - 5 hrs Left sided tap block with exparel Dr. Myra Gianotti to do approach ABDOMINAL EXPOSURE (N/A)  SURGEON:  Surgeon(s) and Role: Panel 1:    Venita Lick, MD - Primary Panel 2:    * Nada Libman, MD - Primary    * Early, Kristen Loader, MD - Assisting  PHYSICIAN ASSISTANT:   ASSISTANTS: Amanda Ward, PA   ANESTHESIA:   general  EBL:  1200 mL   BLOOD ADMINISTERED:1 unit CC PRBC and 320 CC CELLSAVER  DRAINS: none   LOCAL MEDICATIONS USED:  MARCAINE     SPECIMEN:  No Specimen  DISPOSITION OF SPECIMEN:  N/A  COUNTS:  YES  TOURNIQUET:  * No tourniquets in log *  DICTATION: .Dragon Dictation  PLAN OF CARE: Admit to inpatient   PATIENT DISPOSITION:  PACU - hemodynamically stable.

## 2019-09-12 NOTE — OR Nursing (Signed)
PROCEDURE 2. PER PROTOCOL DR.WOODRUFF CALLED AT 15:35 AND CONFIRMED THERE WERE NO FOREIGN OBJECTS RETAINED IN PATIENT.

## 2019-09-12 NOTE — Op Note (Signed)
    Patient name: FIDELA CIESLAK MRN: [id number] DOB: Jul 02, 1955 Sex: female  09/12/2019 Pre-operative Diagnosis: Degenerative lower back disease Post-operative diagnosis:  Same Surgeon:  Durene Cal  Co-surgeon: Venita Lick Assistants: Todd Early Procedure:   Anterior exposure L5-S1 Anesthesia: General Blood Loss: 900 cc Specimens: None  Findings: Extensive pelvic venous varicosities which were very friable and contributed to significant blood loss.  The patient had transitional anatomy.  Indications: This is a 64 year old female who comes in today for lower back instrumentation.  Anterior exposure of the L5-S1 disc space was recommended.  The risks and benefits were discussed with the patient at her preoperative visit.  Procedure:  The patient was identified in the holding area and taken to [facility] OR ROOM 04  The patient was then placed supine on the table. general anesthesia was administered.  The patient was prepped and draped in the usual sterile fashion.  A time out was called and antibiotics were administered.  Fluoroscopy was used to determine the appropriate level of skin incision.  A transverse left lower quadrant incision was made.  Cautery was used about subcutaneous tissue down to the abdominal wall fascia which was opened with cautery.  Subfascial flaps were raised.  I then entered the retroperitoneal space lateral to the rectus muscle.  Upon entering the retroperitoneal space, there were numerous venous collaterals which were very friable.  These were gently protected.  I then identified the external iliac artery which was relatively disease-free.  It was fully mobilized.  I then identified the ureter and protected it laterally.  The iliac vein was then exposed.  There were numerous large branches which were ligated between silk ties.  I then dissected up to the bone.  The peritoneal contents were gently mobilized superiorly and laterally.  The median sacral vessels were ligated  between silk ties.  I then cleared off the right lateral side of the spine.  This was done with the assistance of Wiley retractors.  The NuVasive retractor was positioned.  A 140 blade was then placed on the right lateral side of the spine.  I then gently mobilized the left iliac vein.  Multiple friable pelvic venous varicosities were gently mobilized.  As I tried to further position the retractors some of the venous collaterals started bleeding which led to a fairly significant blood loss.  I asked Dr. Arbie Cookey to assist with control of the bleeding.  This was ultimately done with pledgeted 4-0 Prolene sutures.  Once I had adequate control of the venous bleeding, I was able to position a 140 blade on the left lateral side of the spine.  Inferiorly I placed another 140 blade and superiorly as well.  At this point I had good exposure of the L5-S1 disc space.  A spinal needle was inserted into the disc and fluoroscopy came in to confirm successful localization.  At this point Dr. Shon Baton took over the procedure.  Please see his detailed operative note for the remaining portion of the procedure.   Disposition: To PACU stable.   Juleen China, M.D., [facility] [place] Office: [phone] Pager:  [phone]

## 2019-09-12 NOTE — Transfer of Care (Signed)
Immediate Anesthesia Transfer of Care Note  Patient: Terri Alvarado  Procedure(s) Performed: ANTERIOR LUMBAR FUSION LUMBAR FIVE-SACRAL ONE  ANTERIOR LATERAL LUMBAR FUSION LUMBAR THREE-LUMBAR FIVE (N/A Spine Lumbar) ABDOMINAL EXPOSURE (N/A Abdomen)  Patient Location: PACU  Anesthesia Type:General  Level of Consciousness: awake and alert   Airway & Oxygen Therapy: Patient Spontanous Breathing and Patient connected to face mask oxygen  Post-op Assessment: Report given to RN, Post -op Vital signs reviewed and stable and Patient moving all extremities X 4  Post vital signs: Reviewed and stable  Last Vitals:  Vitals Value Taken Time  BP 98/51 09/12/19 1558  Temp    Pulse 75 09/12/19 1603  Resp 14 09/12/19 1603  SpO2 96 % 09/12/19 1603  Vitals shown include unvalidated device data.  Last Pain:  Vitals:   09/12/19 0653  TempSrc:   PainSc: 0-No pain         Complications: No apparent anesthesia complications

## 2019-09-12 NOTE — Interval H&P Note (Signed)
History and Physical Interval Note:  09/12/2019 8:01 AM  Terri Alvarado  has presented today for surgery, with the diagnosis of Degeneratie scoliosis, failed back syndrome with radiculopathy L3-S1, L2 Acute compression fracture.  The various methods of treatment have been discussed with the patient and family. After consideration of risks, benefits and other options for treatment, the patient has consented to  Procedure(s) with comments: ANTERIOR LUMBAR FUSION L5-S1, ANTERIOR LATERAL LUMBAR FUSION L3-5 (N/A) - 5 hrs Left sided tap block with exparel Dr. Myra Gianotti to do approach ABDOMINAL EXPOSURE (N/A) as a surgical intervention.  The patient's history has been reviewed, patient examined, no change in status, stable for surgery.  I have reviewed the patient's chart and labs.  Questions were answered to the patient's satisfaction.     Durene Cal

## 2019-09-12 NOTE — OR Nursing (Signed)
PER PROTOCOL DR.GREEN CALLED AT 12:14 AND CONFIRMED THE IMAGE WAS CLEAR OF ANY FOREIGN OBJECTS.

## 2019-09-12 NOTE — H&P (Signed)
Addendum H&P  Terri Alvarado is a very pleasant 64 year old woman with debilitating back buttock and radicular leg pain as well as a acute/subacute osteoporotic compression fracture.  Despite appropriate conservative management her overall quality of life is continued to deteriorate.  As result we have elected to move forward with surgery.  Given the complexity of the surgery I have elected to divide this into the 2-day procedure.  Day 1: Anterior interbody fixation L3-S1.  Day 2: L2 kyphoplasty, and supplemental L3-S1 instrumentation.  I have gone over the surgical plan as well as the risks and benefits and alternatives to surgery with the patient again and all of her questions were encouraged and addressed.  There has been no change in her clinical exam since her last office visit of 09/03/2019.

## 2019-09-12 NOTE — Anesthesia Procedure Notes (Signed)
Anesthesia Regional Block: TAP block   Pre-Anesthetic Checklist: ,, timeout performed, Correct Patient, Correct Site, Correct Laterality, Correct Procedure, Correct Position, site marked, Risks and benefits discussed,  Surgical consent,  Pre-op evaluation,  At surgeon's request and post-op pain management  Laterality: Left  Prep: chloraprep       Needles:  Injection technique: Single-shot  Needle Type: Echogenic Stimulator Needle     Needle Length: 10cm  Needle Gauge: 20     Additional Needles:   Procedures:,,,, ultrasound used (permanent image in chart),,,,  Narrative:  Start time: 09/12/2019 8:08 AM End time: 09/12/2019 8:11 AM Injection made incrementally with aspirations every 5 mL.  Performed by: Personally  Anesthesiologist: Lucretia Kern, MD  Additional Notes: Monitors applied. Injection made in 5cc increments. No resistance to injection. Good needle visualization. Patient tolerated procedure well.

## 2019-09-12 NOTE — Anesthesia Procedure Notes (Signed)
Arterial Line Insertion Performed by: Rachel Moulds, CRNA, CRNA  Patient location: Pre-op. Preanesthetic checklist: patient identified, IV checked, site marked, risks and benefits discussed, surgical consent, monitors and equipment checked, pre-op evaluation, timeout performed and anesthesia consent Lidocaine 1% used for infiltration Left, radial was placed Catheter size: 18 G Hand hygiene performed , maximum sterile barriers used  and Seldinger technique used Allen's test indicative of satisfactory collateral circulation Attempts: 1 Procedure performed without using ultrasound guided technique. Following insertion, dressing applied and Biopatch. Post procedure assessment: normal  Patient tolerated the procedure well with no immediate complications.

## 2019-09-12 NOTE — Anesthesia Procedure Notes (Signed)
Procedure Name: Intubation Date/Time: 09/12/2019 8:42 AM Performed by: Valda Favia, CRNA Pre-anesthesia Checklist: Patient identified, Emergency Drugs available, Suction available and Patient being monitored Patient Re-evaluated:Patient Re-evaluated prior to induction Oxygen Delivery Method: Circle System Utilized Preoxygenation: Pre-oxygenation with 100% oxygen Induction Type: IV induction Ventilation: Mask ventilation without difficulty Laryngoscope Size: Mac and 4 Grade View: Grade I Tube type: Oral Tube size: 7.0 mm Number of attempts: 1 Airway Equipment and Method: Stylet Placement Confirmation: ETT inserted through vocal cords under direct vision,  positive ETCO2 and breath sounds checked- equal and bilateral Secured at: 21 cm Tube secured with: Tape Dental Injury: Teeth and Oropharynx as per pre-operative assessment

## 2019-09-13 ENCOUNTER — Inpatient Hospital Stay (HOSPITAL_COMMUNITY): Admission: RE | Admit: 2019-09-13 | Payer: Medicare Other | Source: Home / Self Care | Admitting: Orthopedic Surgery

## 2019-09-13 ENCOUNTER — Inpatient Hospital Stay (HOSPITAL_COMMUNITY): Payer: Medicare Other

## 2019-09-13 ENCOUNTER — Inpatient Hospital Stay (HOSPITAL_COMMUNITY): Payer: Medicare Other | Admitting: Anesthesiology

## 2019-09-13 ENCOUNTER — Encounter (HOSPITAL_COMMUNITY)
Admission: RE | Disposition: A | Discharge status: Home / Self Care | Payer: Self-pay | Source: Home / Self Care | Attending: Orthopedic Surgery

## 2019-09-13 HISTORY — PX: POSTERIOR LUMBAR FUSION 4 LEVEL: SHX6037

## 2019-09-13 LAB — CBC
HCT: 27.4 % — ABNORMAL LOW (ref 36.0–46.0)
Hemoglobin: 7.7 g/dL — ABNORMAL LOW (ref 12.0–15.0)
MCH: 22.4 pg — ABNORMAL LOW (ref 26.0–34.0)
MCHC: 28.1 g/dL — ABNORMAL LOW (ref 30.0–36.0)
MCV: 79.9 fL — ABNORMAL LOW (ref 80.0–100.0)
Platelets: 186 10*3/uL (ref 150–400)
RBC: 3.43 MIL/uL — ABNORMAL LOW (ref 3.87–5.11)
RDW: 18.8 % — ABNORMAL HIGH (ref 11.5–15.5)
WBC: 9.3 10*3/uL (ref 4.0–10.5)
nRBC: 0 % (ref 0.0–0.2)

## 2019-09-13 LAB — CREATININE, SERUM
Creatinine, Ser: 0.81 mg/dL (ref 0.44–1.00)
GFR calc Af Amer: >60 mL/min (ref >60)
GFR calc non Af Amer: >60 mL/min (ref >60)

## 2019-09-13 SURGERY — POSTERIOR LUMBAR FUSION 4 LEVEL
Anesthesia: General | Site: Spine Lumbar

## 2019-09-13 MED ORDER — METHOCARBAMOL 1000 MG/10ML IJ SOLN: 500.0000 mg | Freq: Four times a day (QID) | INTRAVENOUS | Status: DC | PRN

## 2019-09-13 MED ORDER — BUPIVACAINE LIPOSOME 1.3 % IJ SUSP
20.0000 mL | Freq: Once | INTRAMUSCULAR | Status: DC
  Filled 2019-09-13: qty 20

## 2019-09-13 MED ORDER — MIDAZOLAM HCL 2 MG/2ML IJ SOLN
INTRAMUSCULAR | Status: AC
  Filled 2019-09-13: qty 2

## 2019-09-13 MED ORDER — DEXAMETHASONE SODIUM PHOSPHATE 10 MG/ML IJ SOLN
INTRAMUSCULAR | Status: AC
  Filled 2019-09-13: qty 1

## 2019-09-13 MED ORDER — DEXAMETHASONE SODIUM PHOSPHATE 10 MG/ML IJ SOLN
INTRAMUSCULAR | Status: DC | PRN
Start: 2019-09-13 — End: 2019-09-13
  Administered 2019-09-13: 4 mg via INTRAVENOUS

## 2019-09-13 MED ORDER — FLEET ENEMA 7-19 GM/118ML RE ENEM: 1.0000 | ENEMA | Freq: Once | RECTAL | Status: DC | PRN

## 2019-09-13 MED ORDER — MIDAZOLAM HCL 2 MG/2ML IJ SOLN: 0.5000 mg | Freq: Once | INTRAMUSCULAR | Status: DC | PRN

## 2019-09-13 MED ORDER — SODIUM CHLORIDE 0.9 % IV SOLN: 250.0000 mL | INTRAVENOUS | Status: DC

## 2019-09-13 MED ORDER — SUCCINYLCHOLINE CHLORIDE 200 MG/10ML IV SOSY
PREFILLED_SYRINGE | INTRAVENOUS | Status: AC
  Filled 2019-09-13: qty 10

## 2019-09-13 MED ORDER — IOHEXOL 300 MG/ML  SOLN
INTRAMUSCULAR | Status: DC | PRN
  Administered 2019-09-13: 50 mL

## 2019-09-13 MED ORDER — FENTANYL CITRATE (PF) 250 MCG/5ML IJ SOLN
INTRAMUSCULAR | Status: DC | PRN
  Administered 2019-09-13 (×2): 50 ug via INTRAVENOUS

## 2019-09-13 MED ORDER — 0.9 % SODIUM CHLORIDE (POUR BTL) OPTIME
TOPICAL | Status: DC | PRN
  Administered 2019-09-13: 1000 mL

## 2019-09-13 MED ORDER — SODIUM CHLORIDE 0.9% FLUSH
3.0000 mL | Freq: Two times a day (BID) | INTRAVENOUS | Status: DC
  Administered 2019-09-13: 3 mL via INTRAVENOUS

## 2019-09-13 MED ORDER — EPINEPHRINE PF 1 MG/ML IJ SOLN
INTRAMUSCULAR | Status: DC | PRN
  Administered 2019-09-13: .15 mL

## 2019-09-13 MED ORDER — FENTANYL CITRATE (PF) 250 MCG/5ML IJ SOLN
INTRAMUSCULAR | Status: AC
  Filled 2019-09-13: qty 5

## 2019-09-13 MED ORDER — SODIUM CHLORIDE 0.9 % IV SOLN: INTRAVENOUS | Status: DC | PRN

## 2019-09-13 MED ORDER — CEFAZOLIN SODIUM-DEXTROSE 1-4 GM/50ML-% IV SOLN
1.0000 g | Freq: Three times a day (TID) | INTRAVENOUS | Status: AC
  Administered 2019-09-13 (×2): 1 g via INTRAVENOUS
  Filled 2019-09-13 (×2): qty 50

## 2019-09-13 MED ORDER — PHENYLEPHRINE 40 MCG/ML (10ML) SYRINGE FOR IV PUSH (FOR BLOOD PRESSURE SUPPORT)
PREFILLED_SYRINGE | INTRAVENOUS | Status: AC
  Filled 2019-09-13: qty 10

## 2019-09-13 MED ORDER — PHENYLEPHRINE HCL-NACL 10-0.9 MG/250ML-% IV SOLN
INTRAVENOUS | Status: DC | PRN
  Administered 2019-09-13: 100 ug/min via INTRAVENOUS

## 2019-09-13 MED ORDER — MEPERIDINE HCL 25 MG/ML IJ SOLN: 6.2500 mg | INTRAMUSCULAR | Status: DC | PRN

## 2019-09-13 MED ORDER — LACTATED RINGERS IV SOLN: INTRAVENOUS | Status: DC

## 2019-09-13 MED ORDER — EPHEDRINE 5 MG/ML INJ
INTRAVENOUS | Status: AC
  Filled 2019-09-13: qty 10

## 2019-09-13 MED ORDER — ONDANSETRON HCL 4 MG/2ML IJ SOLN
INTRAMUSCULAR | Status: DC | PRN
  Administered 2019-09-13: 4 mg via INTRAVENOUS

## 2019-09-13 MED ORDER — PHENOL 1.4 % MT LIQD: 1.0000 | OROMUCOSAL | Status: DC | PRN

## 2019-09-13 MED ORDER — HEMOSTATIC AGENTS (NO CHARGE) OPTIME
TOPICAL | Status: DC | PRN
  Administered 2019-09-13: 2

## 2019-09-13 MED ORDER — POLYETHYLENE GLYCOL 3350 17 G PO PACK: 17.0000 g | PACK | Freq: Every day | ORAL | Status: DC | PRN

## 2019-09-13 MED ORDER — HYDROMORPHONE HCL 1 MG/ML IJ SOLN
INTRAMUSCULAR | Status: AC
  Filled 2019-09-13: qty 1

## 2019-09-13 MED ORDER — ARTIFICIAL TEARS OPHTHALMIC OINT
TOPICAL_OINTMENT | OPHTHALMIC | Status: AC
  Filled 2019-09-13: qty 3.5

## 2019-09-13 MED ORDER — METHOCARBAMOL 500 MG PO TABS: 500.0000 mg | ORAL_TABLET | Freq: Four times a day (QID) | ORAL | Status: DC | PRN

## 2019-09-13 MED ORDER — PROPOFOL 10 MG/ML IV BOLUS
INTRAVENOUS | Status: DC | PRN
  Administered 2019-09-13: 100 mg via INTRAVENOUS
  Administered 2019-09-13: 20 mg via INTRAVENOUS

## 2019-09-13 MED ORDER — ROCURONIUM BROMIDE 10 MG/ML (PF) SYRINGE
PREFILLED_SYRINGE | INTRAVENOUS | Status: AC
  Filled 2019-09-13: qty 10

## 2019-09-13 MED ORDER — EPINEPHRINE PF 1 MG/ML IJ SOLN
INTRAMUSCULAR | Status: AC
  Filled 2019-09-13: qty 1

## 2019-09-13 MED ORDER — OXYCODONE HCL 5 MG PO TABS: 5.0000 mg | ORAL_TABLET | ORAL | Status: DC | PRN

## 2019-09-13 MED ORDER — BUPIVACAINE LIPOSOME 1.3 % IJ SUSP
INTRAMUSCULAR | Status: DC | PRN
  Administered 2019-09-13: 5 mL

## 2019-09-13 MED ORDER — BUPIVACAINE HCL (PF) 0.25 % IJ SOLN
INTRAMUSCULAR | Status: AC
  Filled 2019-09-13: qty 30

## 2019-09-13 MED ORDER — PROPOFOL 10 MG/ML IV BOLUS
INTRAVENOUS | Status: AC
  Filled 2019-09-13: qty 20

## 2019-09-13 MED ORDER — OXYCODONE HCL 5 MG PO TABS: 10.0000 mg | ORAL_TABLET | ORAL | Status: DC | PRN

## 2019-09-13 MED ORDER — ACETAMINOPHEN 500 MG PO TABS: 1000.0000 mg | ORAL_TABLET | Freq: Once | ORAL | Status: DC

## 2019-09-13 MED ORDER — LIDOCAINE 2% (20 MG/ML) 5 ML SYRINGE
INTRAMUSCULAR | Status: AC
  Filled 2019-09-13: qty 5

## 2019-09-13 MED ORDER — CEFAZOLIN SODIUM 1 G IJ SOLR
INTRAMUSCULAR | Status: AC
  Filled 2019-09-13: qty 20

## 2019-09-13 MED ORDER — THROMBIN (RECOMBINANT) 20000 UNITS EX SOLR
CUTANEOUS | Status: AC
  Filled 2019-09-13: qty 20000

## 2019-09-13 MED ORDER — HYDROMORPHONE HCL 1 MG/ML IJ SOLN
0.2500 mg | INTRAMUSCULAR | Status: DC | PRN
  Administered 2019-09-13 (×2): 0.5 mg via INTRAVENOUS

## 2019-09-13 MED ORDER — PHENYLEPHRINE 40 MCG/ML (10ML) SYRINGE FOR IV PUSH (FOR BLOOD PRESSURE SUPPORT)
PREFILLED_SYRINGE | INTRAVENOUS | Status: DC | PRN
  Administered 2019-09-13: 80 ug via INTRAVENOUS

## 2019-09-13 MED ORDER — ENOXAPARIN SODIUM 40 MG/0.4ML ~~LOC~~ SOLN
40.0000 mg | SUBCUTANEOUS | Status: DC
  Administered 2019-09-14: 40 mg via SUBCUTANEOUS
  Filled 2019-09-13 (×2): qty 0.4

## 2019-09-13 MED ORDER — PROPOFOL 500 MG/50ML IV EMUL
INTRAVENOUS | Status: DC | PRN
  Administered 2019-09-13: 75 ug/kg/min via INTRAVENOUS

## 2019-09-13 MED ORDER — ONDANSETRON HCL 4 MG/2ML IJ SOLN
INTRAMUSCULAR | Status: AC
  Filled 2019-09-13: qty 2

## 2019-09-13 MED ORDER — SUCCINYLCHOLINE CHLORIDE 200 MG/10ML IV SOSY
PREFILLED_SYRINGE | INTRAVENOUS | Status: DC | PRN
  Administered 2019-09-13: 120 mg via INTRAVENOUS

## 2019-09-13 MED ORDER — BUPIVACAINE HCL (PF) 0.25 % IJ SOLN
INTRAMUSCULAR | Status: DC | PRN
  Administered 2019-09-13: 5 mL
  Administered 2019-09-13: 18 mL

## 2019-09-13 MED ORDER — HYDROMORPHONE HCL 1 MG/ML IJ SOLN: 1.0000 mg | INTRAMUSCULAR | Status: DC | PRN

## 2019-09-13 MED ORDER — GABAPENTIN 300 MG PO CAPS
300.0000 mg | ORAL_CAPSULE | Freq: Three times a day (TID) | ORAL | Status: DC
  Administered 2019-09-13 – 2019-09-14 (×5): 300 mg via ORAL
  Filled 2019-09-13 (×5): qty 1

## 2019-09-13 MED ORDER — MENTHOL 3 MG MT LOZG: 1.0000 | LOZENGE | OROMUCOSAL | Status: DC | PRN

## 2019-09-13 MED ORDER — PROMETHAZINE HCL 25 MG/ML IJ SOLN: 6.2500 mg | INTRAMUSCULAR | Status: DC | PRN

## 2019-09-13 MED ORDER — SODIUM CHLORIDE 0.9% FLUSH: 3.0000 mL | INTRAVENOUS | Status: DC | PRN

## 2019-09-13 MED ORDER — DOCUSATE SODIUM 100 MG PO CAPS: 100.0000 mg | ORAL_CAPSULE | Freq: Two times a day (BID) | ORAL | Status: DC

## 2019-09-13 MED ORDER — MIDAZOLAM HCL 5 MG/5ML IJ SOLN
INTRAMUSCULAR | Status: DC | PRN
  Administered 2019-09-13: 2 mg via INTRAVENOUS

## 2019-09-13 MED FILL — Sodium Chloride Irrigation Soln 0.9%: Qty: 3000 | Status: AC

## 2019-09-13 MED FILL — Thrombin (Recombinant) For Soln 20000 Unit: CUTANEOUS | Qty: 1 | Status: AC

## 2019-09-13 MED FILL — Sodium Chloride IV Soln 0.9%: INTRAVENOUS | Qty: 1000 | Status: AC

## 2019-09-13 MED FILL — Heparin Sodium (Porcine) Inj 1000 Unit/ML: INTRAMUSCULAR | Qty: 30 | Status: AC

## 2019-09-13 SURGICAL SUPPLY — 78 items
BLADE CLIPPER SURG (BLADE) IMPLANT
CABLE BIPOLOR RESECTION CORD (MISCELLANEOUS) ×3 IMPLANT
CEMENT KYPHON CX01A KIT/MIXER (Cement) ×2 IMPLANT
CLIP NEUROVISION LG (CLIP) ×2 IMPLANT
CLOSURE STERI-STRIP 1/2X4 (GAUZE/BANDAGES/DRESSINGS) ×2
CLSR STERI-STRIP ANTIMIC 1/2X4 (GAUZE/BANDAGES/DRESSINGS) ×3 IMPLANT
COVER MAYO STAND STRL (DRAPES) ×6 IMPLANT
COVER SURGICAL LIGHT HANDLE (MISCELLANEOUS) ×3 IMPLANT
COVER WAND RF STERILE (DRAPES) ×3 IMPLANT
CURETTE EXPRESS SZ2 7MM (INSTRUMENTS) IMPLANT
CURRETTE EXPRESS SZ2 7MM (INSTRUMENTS) ×3
DRAPE C-ARM 42X72 X-RAY (DRAPES) ×6 IMPLANT
DRAPE C-ARMOR (DRAPES) ×3 IMPLANT
DRAPE POUCH INSTRU U-SHP 10X18 (DRAPES) ×3 IMPLANT
DRAPE SURG 17X23 STRL (DRAPES) ×3 IMPLANT
DRAPE U-SHAPE 47X51 STRL (DRAPES) ×3 IMPLANT
DRSG OPSITE POSTOP 4X6 (GAUZE/BANDAGES/DRESSINGS) ×4 IMPLANT
DRSG OPSITE POSTOP 4X8 (GAUZE/BANDAGES/DRESSINGS) ×3 IMPLANT
DURAPREP 26ML APPLICATOR (WOUND CARE) ×3 IMPLANT
ELECT BLADE 4.0 EZ CLEAN MEGAD (MISCELLANEOUS) ×3
ELECT BLADE 6.5 EXT (BLADE) ×3 IMPLANT
ELECT PENCIL ROCKER SW 15FT (MISCELLANEOUS) ×3 IMPLANT
ELECT REM PT RETURN 9FT ADLT (ELECTROSURGICAL) ×3
ELECTRODE BLDE 4.0 EZ CLN MEGD (MISCELLANEOUS) ×1 IMPLANT
ELECTRODE REM PT RTRN 9FT ADLT (ELECTROSURGICAL) ×1 IMPLANT
GLOVE BIOGEL PI IND STRL 8.5 (GLOVE) ×1 IMPLANT
GLOVE BIOGEL PI INDICATOR 8.5 (GLOVE) ×2
GLOVE SS BIOGEL STRL SZ 8.5 (GLOVE) ×1 IMPLANT
GLOVE SUPERSENSE BIOGEL SZ 8.5 (GLOVE) ×2
GOWN STRL REUS W/ TWL LRG LVL3 (GOWN DISPOSABLE) ×2 IMPLANT
GOWN STRL REUS W/TWL 2XL LVL3 (GOWN DISPOSABLE) ×3 IMPLANT
GOWN STRL REUS W/TWL LRG LVL3 (GOWN DISPOSABLE) ×6
GUIDEWIRE NITINOL BEVEL TIP (WIRE) ×16 IMPLANT
KIT BASIN OR (CUSTOM PROCEDURE TRAY) ×3 IMPLANT
KIT POSITION SURG JACKSON T1 (MISCELLANEOUS) ×3 IMPLANT
KIT TURNOVER KIT B (KITS) ×3 IMPLANT
MODULE EMG NDL SSEP NVM5 (NEEDLE) IMPLANT
MODULE EMG NEEDLE SSEP NVM5 (NEEDLE) ×3 IMPLANT
MODULE NVM5 NEXT GEN EMG (NEEDLE) ×2 IMPLANT
NDL SPNL 18GX3.5 QUINCKE PK (NEEDLE) ×1 IMPLANT
NEEDLE 22X1 1/2 (OR ONLY) (NEEDLE) ×3 IMPLANT
NEEDLE SPNL 18GX3.5 QUINCKE PK (NEEDLE) ×3 IMPLANT
NS IRRIG 1000ML POUR BTL (IV SOLUTION) ×3 IMPLANT
PACK LAMINECTOMY ORTHO (CUSTOM PROCEDURE TRAY) ×3 IMPLANT
PACK UNIVERSAL I (CUSTOM PROCEDURE TRAY) ×3 IMPLANT
PAD ARMBOARD 7.5X6 YLW CONV (MISCELLANEOUS) ×6 IMPLANT
PATTIES SURGICAL .5 X.5 (GAUZE/BANDAGES/DRESSINGS) IMPLANT
PATTIES SURGICAL .5 X1 (DISPOSABLE) ×3 IMPLANT
POSITIONER HEAD PRONE TRACH (MISCELLANEOUS) ×3 IMPLANT
PROBE BALL TIP NVM5 SNG USE (BALLOONS) ×2 IMPLANT
ROD RELINE MAS LORD 5.5X90MM (Rod) ×2 IMPLANT
ROD RELINE MAS LORD 5.5X95MM (Rod) ×2 IMPLANT
SCREW LOCK RELINE 5.5 TULIP (Screw) ×16 IMPLANT
SCREW RELINE MAS POLY 6.5X40MM (Screw) ×2 IMPLANT
SCREW RELINE MAS RED 5.5X45MM (Screw) ×4 IMPLANT
SCREW RELINE RED 6.5X45MM POLY (Screw) ×2 IMPLANT
SCREW RELINE RED POLY 6.5X35MM (Screw) ×4 IMPLANT
SCREW SPINAL REDUCE 5.5X40 C2 (Screw) ×4 IMPLANT
SPONGE LAP 4X18 RFD (DISPOSABLE) ×6 IMPLANT
SPONGE SURGIFOAM ABS GEL 100 (HEMOSTASIS) ×3 IMPLANT
SURGIFLO W/THROMBIN 8M KIT (HEMOSTASIS) ×2 IMPLANT
SUT BONE WAX W31G (SUTURE) ×3 IMPLANT
SUT MNCRL AB 3-0 PS2 18 (SUTURE) ×3 IMPLANT
SUT VIC AB 0 CT1 27 (SUTURE) ×9
SUT VIC AB 0 CT1 27XBRD ANBCTR (SUTURE) IMPLANT
SUT VIC AB 1 CT1 18XCR BRD 8 (SUTURE) ×1 IMPLANT
SUT VIC AB 1 CT1 8-18 (SUTURE) ×6
SUT VIC AB 1 CTX 36 (SUTURE)
SUT VIC AB 1 CTX36XBRD ANBCTR (SUTURE) IMPLANT
SUT VIC AB 2-0 CT1 18 (SUTURE) ×8 IMPLANT
SYR BULB IRRIG 60ML STRL (SYRINGE) ×3 IMPLANT
SYR CONTROL 10ML LL (SYRINGE) ×3 IMPLANT
SYR TB 1ML LUER SLIP (SYRINGE) ×2 IMPLANT
TOWEL GREEN STERILE (TOWEL DISPOSABLE) ×3 IMPLANT
TOWEL GREEN STERILE FF (TOWEL DISPOSABLE) ×3 IMPLANT
TRAY KYPHOPAK 15/3 ONESTEP 1ST (MISCELLANEOUS) ×2 IMPLANT
WATER STERILE IRR 1000ML POUR (IV SOLUTION) ×3 IMPLANT
YANKAUER SUCT BULB TIP NO VENT (SUCTIONS) ×3 IMPLANT

## 2019-09-13 NOTE — Anesthesia Postprocedure Evaluation (Signed)
Anesthesia Post Note  Patient: Terri Alvarado  Procedure(s) Performed: ANTERIOR LUMBAR FUSION LUMBAR FIVE-SACRAL ONE  ANTERIOR LATERAL LUMBAR FUSION LUMBAR THREE-LUMBAR FIVE (N/A Spine Lumbar) ABDOMINAL EXPOSURE (N/A Abdomen)     Patient location during evaluation: PACU Anesthesia Type: General Level of consciousness: awake and alert Pain management: pain level controlled Vital Signs Assessment: post-procedure vital signs reviewed and stable Respiratory status: spontaneous breathing, nonlabored ventilation and respiratory function stable Cardiovascular status: blood pressure returned to baseline and stable Postop Assessment: no apparent nausea or vomiting Anesthetic complications: no    Last Vitals:  Vitals:   09/12/19 2353 09/13/19 0336  BP: 114/71 113/72  Pulse: 75 77  Resp: 16 16  Temp: 36.5 C 36.6 C  SpO2: 94% 95%    Last Pain:  Vitals:   09/13/19 0437  TempSrc:   PainSc: Asleep                 Lucretia Kern

## 2019-09-13 NOTE — Op Note (Signed)
Operative report  Preoperative diagnosis: L2 compression fracture.  Degenerative lumbar scoliosis, degenerative lumbar spinal stenosis with recurrent neuropathic claudication and radicular pain.  Status post anterior interbody fusion L5-S1, lateral interbody fusion L3-5.  Postoperative diagnosis: Same  Operative procedure: Posterior supplemental pedicle screw fixation L3-S1.  L2 kyphoplasty.  First Assistant: Cleta Alberts, PA  Implants: NuVasive MIS pedicle screw fixation.   L3  L4  L5  S1 Left: 5.5X40  5.5X40  6.5X40  6.5X35 Right: 5.5X45  5.5X45  1.7O16  0.7P71  Complications: None  Neuro monitoring: No adverse free running EMG activity or SSEP activity.  All pedicle screws were directly stimulated and there was no adverse activity at greater than 40 mA.  Indications: Terri Alvarado is a very pleasant 64 year old man who has significant ongoing back buttock and radicular leg pain.  Attempts at conservative management had failed to alleviate her symptoms so he elected to proceed with a planned two-stage procedure.  Patient completed the anterior interbody fixation yesterday and returned today for posterior fixation.  She did note improvement in the radicular leg pain, with her primary complaint being incisional surgical pain.  After discussing risks benefits and alternatives we elected to move forward with the planned posterior surgery.  Operative report: Patient was brought the operating room.  After successful induction of general anesthesia and endotracheal ovation teds SCDs were reapplied and the neuro monitoring rep inserted all of her appropriate needles and monitoring devices patient was turned prone onto the [facility] spine frame and all bony prominences were well-padded.  The back was prepped and draped in a standard fashion.  Timeout was taken to confirm patient procedure and all other important data.  Using fluoroscopy identified the lateral border of the L3 pedicle.  I marked this out and  infiltrated the area with quarter percent Marcaine with epinephrine.  An incision was made and the Jamshidi needle was advanced down to the lateral aspect of the pedicle.  I then advanced the Jamshidi needle into the pedicle using AP fluoroscopy and direct stimulation of the trocar.  As I neared the medial wall of the pedicle on the AP view I switched to the lateral to confirm that I was just beyond the posterior wall of the vertebral body.  This allowed me to confirm satisfactory position and trajectory.  In addition there was no adverse free running EMG activity.  I advanced the Jamshidi needle into the vertebral body and then placed the guidepin to cannulate the pedicle.  Using this exact same technique I cannulated the L4-L5 and S1 pedicles on the left side.  I then went to the contralateral side and again cannulated all of the pedicles from L3-S1.  Imaging studies confirmed satisfactory position of all 8 guidepins.  I then measured and then placed the appropriate size pedicle screw over the guidepin and into the pedicle and vertebral body.  Once all 8 pedicle screws were positioned I then directly stimulated each pedicle screw and there was no adverse activity at greater than 40 mA.  I then measured and then passed a rod and locked the rod with the locking nuts.  All locking nuts were torqued according to manufacture standards.  At this point imaging confirmed satisfactory pedicle screw rod fixation L3-S1.  There was no migration or change in the position of the anterior interbody grafts.  At this point I then placed the Jamshidi needle at the lateral aspect of the L2 pedicle and again using the same technique advanced this into the L2 pedicle.  As I advanced to the medial wall on the AP view I switched to the lateral view to ensure that I was beyond the posterior wall of the vertebral body.  I then repeated this on the contralateral side.  At this point both L2 pedicles were cannulated.  I advanced the drill  and then placed the curette and finally the inflatable bone tamp.  I inflated the bone tamps and then deflated them and inserted the bone cement.  A total of 6 cc of cement was administered into the L2 vertebral body.  Imaging studies confirm satisfactory position of the cement mantle with no leak anteriorly, posteriorly, laterally, superiorly, or inferiorly.  At this point with the kyphoplasty and the fusion complete I took final images with fluoroscopy confirming satisfactory position of the hardware and the bone cement mantle.  The wounds were copiously irrigated with normal saline and closed in a layered fashion with interrupted #1 Vicryl suture, 2-0 Vicryl suture, and a 3-0 Monocryl stitch.  A total of 10 cc of Marcaine mixed with Exparel was administered into the paraspinal musculature for postoperative analgesia.  Steri-Strips and a dry dressing were applied and the patient was ultimately extubated transfer the PACU without incident.  The end of the case all needle sponge counts were correct.  There were no adverse intraoperative events.

## 2019-09-13 NOTE — Evaluation (Signed)
Physical Therapy Evaluation Patient Details Name: Terri Alvarado MRN: [id number] DOB: Jun 09, 1955 Today's Date: 09/13/2019   History of Present Illness  pt is a 64 y/o female with debilitating back, buttoch and radicular leg pain.  Pt is s/p stage 1 ALIF at L5, S1 and LIF at L 3-5 with a wealth of instramentation.  Stage 2, kyphoplasty for L3 compression fx and PLIF L3-S1.  PMhx: Deg scoliosis, HTN, lumbar radiculopathy, spondylosis, claudication.  Clinical Impression  Pt admitted with/for lumbar fusion surgery in 2 stages and a kyphoplasty.  Pt needing mod assist for most mobility today with soft BP's.  Pt currently limited functionally due to the problems listed below.  (see problems list.)  Pt will benefit from PT to maximize function and safety to be able to get home safely with available assist.     Follow Up Recommendations Follow surgeon's recommendation for DC plan and follow-up therapies;Other (comment)    Equipment Recommendations  None recommended by PT;Other (comment)(pt states they have RW)    Recommendations for Other Services       Precautions / Restrictions Precautions Precautions: Back Required Braces or Orthoses: Spinal Brace Restrictions Weight Bearing Restrictions: No      Mobility  Bed Mobility Overal bed mobility: Needs Assistance Bed Mobility: Rolling;Sidelying to Sit;Sit to Sidelying Rolling: Mod assist Sidelying to sit: Mod assist     Sit to sidelying: Mod assist General bed mobility comments: cued for technique, assisted with roll and coming up from sidelying  Transfers Overall transfer level: Needs assistance Equipment used: Rolling walker (2 wheeled) Transfers: Sit to/from Stand Sit to Stand: Mod assist;Min assist         General transfer comment: depending on height of the bed.  cues for hand placement, assist to come forward and up.  Ambulation/Gait             General Gait Details: side stepping only with mod face to face  assist  Stairs            Wheelchair Mobility    Modified Rankin (Stroke Patients Only)       Balance Overall balance assessment: Mild deficits observed, not formally tested                                           Pertinent Vitals/Pain Pain Assessment: 0-10 Pain Score: 10-Worst pain ever Pain Location: incision, back Pain Descriptors / Indicators: Burning;Grimacing;Guarding;Sore;Throbbing Pain Intervention(s): Limited activity within patient's tolerance;Monitored during session;Premedicated before session    Home Living Family/patient expects to be discharged to:: Private residence Living Arrangements: Spouse/significant other;Children;Other (Comment)(Going to son and dtr in [place] home) Available Help at Discharge: Family;Available 24 hours/day Type of Home: House Home Access: Stairs to enter Entrance Stairs-Rails: Psychiatric nurse of Steps: 2 Home Layout: One level Home Equipment: Environmental consultant - 2 wheels      Prior Function Level of Independence: Independent         Comments: working still     Hand Dominance   Dominant Hand: Right    Extremity/Trunk Assessment   Upper Extremity Assessment Upper Extremity Assessment: Overall WFL for tasks assessed    Lower Extremity Assessment Lower Extremity Assessment: Generalized weakness    Cervical / Trunk Assessment Cervical / Trunk Assessment: Other exceptions(surgical back)  Communication   Communication: No difficulties  Cognition Arousal/Alertness: Awake/alert Behavior During Therapy: Restless Overall Cognitive Status: Within Functional  Limits for tasks assessed(but drugged up)                                        General Comments General comments (skin integrity, edema, etc.): pt/dtr instructed in back care/prec, log roll, transitions, lifting restrictions, bracing issues and progression of activity.    Exercises     Assessment/Plan    PT  Assessment Patient needs continued PT services  PT Problem List Decreased strength;Decreased activity tolerance;Decreased mobility;Decreased knowledge of use of DME;Decreased knowledge of precautions;Pain       PT Treatment Interventions DME instruction;Gait training;Stair training;Functional mobility training;Therapeutic activities;Patient/family education    PT Goals (Current goals can be found in the Care Plan section)  Acute Rehab PT Goals Patient Stated Goal: independent, less pain, ?back to work PT Goal Formulation: With patient Time For Goal Achievement: 09/20/19 Potential to Achieve Goals: Good    Frequency Min 5X/week   Barriers to discharge        Co-evaluation               AM-PAC PT "6 Clicks" Mobility  Outcome Measure Help needed turning from your back to your side while in a flat bed without using bedrails?: A Lot Help needed moving from lying on your back to sitting on the side of a flat bed without using bedrails?: A Lot Help needed moving to and from a bed to a chair (including a wheelchair)?: A Lot Help needed standing up from a chair using your arms (e.g., wheelchair or bedside chair)?: A Lot Help needed to walk in hospital room?: A Lot Help needed climbing 3-5 steps with a railing? : A Lot 6 Click Score: 12    End of Session Equipment Utilized During Treatment: Oxygen;Back brace Activity Tolerance: Treatment limited secondary to medical complications (Comment);Patient limited by pain(soft BP's) Patient left: in bed;with call bell/phone within reach;with bed alarm set;with family/visitor present;with SCD's reapplied Nurse Communication: Patient requests pain meds;Precautions;Mobility status PT Visit Diagnosis: Other abnormalities of gait and mobility (R26.89);Pain;Muscle weakness (generalized) (M62.81) Pain - part of body: (back)    Time: 1610-9604 PT Time Calculation (min) (ACUTE ONLY): 43 min   Charges:   PT Evaluation $PT Eval Moderate  Complexity: 1 Mod PT Treatments $Gait Training: 8-22 mins $Self Care/Home Management: 8-22        09/13/2019  Jacinto Halim., PT Acute Rehabilitation Services [phone]  (pager) [phone]  (office)  Eliseo Gum Symphany Fleissner 09/13/2019, 5:11 PM

## 2019-09-13 NOTE — Progress Notes (Signed)
Report on patient and patient's condition given to Anesthesia/OR.

## 2019-09-13 NOTE — Transfer of Care (Signed)
Immediate Anesthesia Transfer of Care Note  Patient: Terri Alvarado  Procedure(s) Performed: POSTERIOR LUMBAR FUSION INTERBODY LUMBAR THREE - SACRAL ONE, LUMBAR THREE KYPHOPLASTY (N/A Spine Lumbar)  Patient Location: PACU  Anesthesia Type:General  Level of Consciousness: awake, alert  and oriented  Airway & Oxygen Therapy: Patient Spontanous Breathing and Patient connected to face mask oxygen  Post-op Assessment: Report given to RN, Post -op Vital signs reviewed and stable and Patient moving all extremities X 4  Post vital signs: Reviewed and stable  Last Vitals:  Vitals Value Taken Time  BP 105/66 09/13/19 1147  Temp 36.6 C 09/13/19 1147  Pulse 100 09/13/19 1150  Resp 17 09/13/19 1153  SpO2 90 % 09/13/19 1150  Vitals shown include unvalidated device data.  Last Pain:  Vitals:   09/13/19 0437  TempSrc:   PainSc: Asleep      Patients Stated Pain Goal: 3 (09/12/19 1833)  Complications: No apparent anesthesia complications

## 2019-09-13 NOTE — Progress Notes (Signed)
    Subjective: Procedure(s) (LRB): POSTERIOR LUMBAR FUSION INTERBODY L3-S1, L3 KYPHOPLASTY (N/A) Day of Surgery  Patient reports pain as 5 on 0-10 scale.  Reports decreased leg pain reports incisional back pain   N/A void - foley in place Negative bowel movement Negative flatus Negative chest pain or shortness of breath  Objective: Vital signs in last 24 hours: Temp:  [97 F (36.1 C)-97.8 F (36.6 C)] 97.8 F (36.6 C) (05/13 0336) Pulse Rate:  [69-81] 77 (05/13 0336) Resp:  [10-22] 16 (05/13 0336) BP: (98-130)/(51-92) 113/72 (05/13 0336) SpO2:  [91 %-100 %] 95 % (05/13 0336)  Intake/Output from previous day: 05/12 0701 - 05/13 0700 In: 4395 [I.V.:3150; Blood:635; IV Piggyback:610] Out: 2550 [Urine:1350; Blood:1200]  Labs: Recent Labs    09/10/19 1100 09/12/19 1009 09/12/19 1426 09/12/19 2123  WBC 5.2  --   --  8.7  RBC 4.28  --   --  3.81*  HCT 31.0*   < > 21.0* 29.1*  PLT 396  --   --  192   < > = values in this interval not displayed.   Recent Labs    09/10/19 1100 09/10/19 1100 09/12/19 1009 09/12/19 1009 09/12/19 1109 09/12/19 1426  NA 137   < > 137   < > 137 139  K 4.3   < > 4.2   < > 4.0 3.8  CL 104   < > 104  --   --  107  CO2 21*  --   --   --   --   --   BUN 24*   < > 18  --   --  16  CREATININE 0.88   < > 0.60  --   --  0.60  GLUCOSE 109*   < > 138*  --   --  159*  CALCIUM 9.3  --   --   --   --   --    < > = values in this interval not displayed.   Recent Labs    09/10/19 1100  INR 0.9    Physical Exam: Neurologically intact ABD soft Intact pulses distally Dorsiflexion/Plantar flexion intact Incision: dressing C/D/I and no drainage Compartment soft neg nerve root tension signs Body mass index is 27.88 kg/m.   Assessment/Plan: Patient stable  xrays n/a Continue mobilization with physical therapy Continue care  1.  Patient overall is made improvements.  Hemoglobin last night and responded appropriately to her transfusion,  and there is no evidence of ongoing active bleeding. 2.  Patient's neurogenic claudication pain has significantly improved.  Her primary source of pain is incisional secondary to her surgery. 3.  Abdomen is soft and shows no signs of significant swelling or rebound tenderness. 4.  Plan on moving forward with supplemental posterior fixation L3-S1 along with the L2 kyphoplasty today. 5.  Have spoken with the patient's daughter and explained the surgical procedure all of her questions were addressed.  I have also gone over the procedure with the patient along with the risks and benefits and all of her questions were addressed.  Venita Lick, MD [facility] [phone]

## 2019-09-13 NOTE — Anesthesia Procedure Notes (Signed)
Procedure Name: Intubation Date/Time: 09/13/2019 7:38 AM Performed by: Myna Bright, CRNA Pre-anesthesia Checklist: Patient identified, Emergency Drugs available, Suction available and Patient being monitored Patient Re-evaluated:Patient Re-evaluated prior to induction Oxygen Delivery Method: Circle system utilized Preoxygenation: Pre-oxygenation with 100% oxygen Induction Type: IV induction Ventilation: Mask ventilation without difficulty Laryngoscope Size: Mac and 3 Grade View: Grade I Tube type: Oral Tube size: 7.0 mm Number of attempts: 1 Airway Equipment and Method: Stylet Placement Confirmation: ETT inserted through vocal cords under direct vision,  positive ETCO2 and breath sounds checked- equal and bilateral Secured at: 21 cm Tube secured with: Tape Dental Injury: Teeth and Oropharynx as per pre-operative assessment

## 2019-09-13 NOTE — Anesthesia Postprocedure Evaluation (Signed)
Anesthesia Post Note  Patient: Shantinique S Nosbisch  Procedure(s) Performed: POSTERIOR LUMBAR FUSION INTERBODY LUMBAR THREE - SACRAL ONE, LUMBAR THREE KYPHOPLASTY (N/A Spine Lumbar)     Patient location during evaluation: PACU Anesthesia Type: General Level of consciousness: oriented, sedated and patient cooperative Pain management: pain level controlled Vital Signs Assessment: post-procedure vital signs reviewed and stable Respiratory status: spontaneous breathing, nonlabored ventilation, respiratory function stable and patient connected to nasal cannula oxygen Cardiovascular status: blood pressure returned to baseline and stable Postop Assessment: no apparent nausea or vomiting Anesthetic complications: no    Last Vitals:  Vitals:   09/13/19 1332 09/13/19 1338  BP:    Pulse: 85 86  Resp: 20 17  Temp:  (!) 36.1 C  SpO2: 93% 98%    Last Pain:  Vitals:   09/13/19 1215  TempSrc:   PainSc: 10-Worst pain ever                 Francessca Friis,E. Joyia Riehle

## 2019-09-13 NOTE — Brief Op Note (Signed)
09/12/2019 - 09/13/2019  11:09 AM  PATIENT:  Terri Alvarado  64 y.o. female  PRE-OPERATIVE DIAGNOSIS:  Degenerative scoliosis, failed back syndrome with radiculopathy, L3 actue compression fracture  POST-OPERATIVE DIAGNOSIS:  Degenerative scoliosis, failed back syndrome with radiculopathy, L3 actue compression fracture  PROCEDURE:  Procedure(s) with comments: POSTERIOR LUMBAR FUSION INTERBODY LUMBAR THREE - SACRAL ONE, LUMBAR THREE KYPHOPLASTY (N/A) - 4 hrs  SURGEON:  Surgeon(s) and Role:    Venita Lick, MD - Primary  PHYSICIAN ASSISTANT:   ASSISTANTS: amanda ward, pa   ANESTHESIA:   general  EBL:  100 mL   BLOOD ADMINISTERED:1 unit CC PRBC  DRAINS: none   LOCAL MEDICATIONS USED:  MARCAINE    and OTHER exparel  SPECIMEN:  No Specimen  DISPOSITION OF SPECIMEN:  N/A  COUNTS:  YES  TOURNIQUET:  * No tourniquets in log *  DICTATION: .Dragon Dictation  PLAN OF CARE: Admit to inpatient   PATIENT DISPOSITION:  PACU - hemodynamically stable.

## 2019-09-14 ENCOUNTER — Encounter: Payer: Self-pay | Admitting: *Deleted

## 2019-09-14 LAB — BPAM RBC
Blood Product Expiration Date: 202105192359
Blood Product Expiration Date: 202106132359
Blood Product Expiration Date: 202106132359
Blood Product Expiration Date: 202106132359
Blood Product Expiration Date: 202106132359
ISSUE DATE / TIME: 202105121027
ISSUE DATE / TIME: 202105130836
ISSUE DATE / TIME: 202105130836
Unit Type and Rh: 5100
Unit Type and Rh: 5100
Unit Type and Rh: 5100
Unit Type and Rh: 5100
Unit Type and Rh: 5100

## 2019-09-14 LAB — TYPE AND SCREEN
ABO/RH(D): O POS
Antibody Screen: NEGATIVE
Unit division: 0
Unit division: 0
Unit division: 0
Unit division: 0
Unit division: 0

## 2019-09-14 LAB — POCT I-STAT, CHEM 8
BUN: 15 mg/dL (ref 8–23)
Calcium, Ion: 1.03 mmol/L — ABNORMAL LOW (ref 1.15–1.40)
Chloride: 101 mmol/L (ref 98–111)
Creatinine, Ser: 0.6 mg/dL (ref 0.44–1.00)
Glucose, Bld: 138 mg/dL — ABNORMAL HIGH (ref 70–99)
HCT: 27 % — ABNORMAL LOW (ref 36.0–46.0)
Hemoglobin: 9.2 g/dL — ABNORMAL LOW (ref 12.0–15.0)
Potassium: 3.9 mmol/L (ref 3.5–5.1)
Sodium: 135 mmol/L (ref 135–145)
TCO2: 25 mmol/L (ref 22–32)

## 2019-09-14 LAB — CBC
HCT: 27.8 % — ABNORMAL LOW (ref 36.0–46.0)
Hemoglobin: 8.5 g/dL — ABNORMAL LOW (ref 12.0–15.0)
MCH: 24.7 pg — ABNORMAL LOW (ref 26.0–34.0)
MCHC: 30.6 g/dL (ref 30.0–36.0)
MCV: 80.8 fL (ref 80.0–100.0)
Platelets: 155 10*3/uL (ref 150–400)
RBC: 3.44 MIL/uL — ABNORMAL LOW (ref 3.87–5.11)
RDW: 21.2 % — ABNORMAL HIGH (ref 11.5–15.5)
WBC: 8.2 10*3/uL (ref 4.0–10.5)
nRBC: 0 % (ref 0.0–0.2)

## 2019-09-14 MED ORDER — ENOXAPARIN SODIUM 40 MG/0.4ML ~~LOC~~ SOLN
40.0000 mg | SUBCUTANEOUS | 0 refills | Status: DC
Start: 2019-09-14 — End: 2021-02-06

## 2019-09-14 MED ORDER — OXYCODONE-ACETAMINOPHEN 10-325 MG PO TABS: 1.0000 | ORAL_TABLET | Freq: Four times a day (QID) | ORAL | 0 refills | Status: AC | PRN

## 2019-09-14 MED ORDER — METHOCARBAMOL 500 MG PO TABS: 500.0000 mg | ORAL_TABLET | Freq: Three times a day (TID) | ORAL | 0 refills | Status: AC | PRN

## 2019-09-14 MED ORDER — FERROUS SULFATE 325 (65 FE) MG PO TABS: 325.0000 mg | ORAL_TABLET | Freq: Three times a day (TID) | ORAL | 0 refills | Status: DC

## 2019-09-14 MED ORDER — ONDANSETRON HCL 4 MG PO TABS: 4.0000 mg | ORAL_TABLET | Freq: Three times a day (TID) | ORAL | 0 refills | Status: DC | PRN

## 2019-09-14 MED ORDER — FERROUS SULFATE 325 (65 FE) MG PO TABS
325.0000 mg | ORAL_TABLET | Freq: Three times a day (TID) | ORAL | Status: DC
  Administered 2019-09-14 (×3): 325 mg via ORAL
  Filled 2019-09-14 (×3): qty 1

## 2019-09-14 NOTE — Discharge Instructions (Signed)

## 2019-09-14 NOTE — Progress Notes (Signed)
Physical Therapy Treatment Patient Details Name: Terri Alvarado MRN: [id number] DOB: [birth year] Today's Date: 09/14/2019    History of Present Illness Pt is a 64 y/o female with debilitating back, buttock and radicular leg pain.  Pt is s/p stage 1 ALIF at L5, S1 and LIF at L 3-5 with a wealth of instramentation.  Stage 2, kyphoplasty for L3 compression fx and PLIF L3-S1.  PMhx: Deg scoliosis, HTN, lumbar radiculopathy, spondylosis, claudication.    PT Comments    Pt making steady progress with functional mobility as indicating by her tolerance to gait training into hallway with RW and min guard for safety. Pt remains limited secondary to pain and requesting to attempt stair training at next session. Pt's daughter present at end of session. Pt would continue to benefit from skilled physical therapy services at this time while admitted and after d/c to address the below listed limitations in order to improve overall safety and independence with functional mobility.    Follow Up Recommendations  Home health PT     Equipment Recommendations  None recommended by PT    Recommendations for Other Services       Precautions / Restrictions Precautions Precautions: Back Precaution Booklet Issued: Yes (comment) Precaution Comments: reviewed back precautions with pt, good recall Required Braces or Orthoses: Spinal Brace Spinal Brace: Lumbar corset;Applied in sitting position Restrictions Weight Bearing Restrictions: No    Mobility  Bed Mobility Overal bed mobility: Needs Assistance Bed Mobility: Rolling;Sidelying to Sit;Sit to Sidelying Rolling: Supervision Sidelying to sit: Supervision     Sit to sidelying: Min assist General bed mobility comments: physical assistance needed to return bilateral LEs onto bed  Transfers Overall transfer level: Needs assistance Equipment used: Rolling walker (2 wheeled) Transfers: Sit to/from Stand Sit to Stand: Min guard         General  transfer comment: good hand placement and technique, min guard for safety  Ambulation/Gait Ambulation/Gait assistance: Min guard Gait Distance (Feet): 75 Feet Assistive device: Rolling walker (2 wheeled) Gait Pattern/deviations: Step-through pattern;Decreased stride length Gait velocity: decreased   General Gait Details: pt with slow, steady gait with use of RW; no LOB or need for physical assistance   Stairs             Wheelchair Mobility    Modified Rankin (Stroke Patients Only)       Balance Overall balance assessment: Needs assistance Sitting-balance support: Feet supported Sitting balance-Leahy Scale: Good     Standing balance support: During functional activity;Bilateral upper extremity supported Standing balance-Leahy Scale: Poor                              Cognition Arousal/Alertness: Awake/alert Behavior During Therapy: WFL for tasks assessed/performed Overall Cognitive Status: Within Functional Limits for tasks assessed                                        Exercises      General Comments        Pertinent Vitals/Pain Pain Assessment: 0-10 Pain Score: 8  Pain Location: incision, back Pain Descriptors / Indicators: Burning;Grimacing;Guarding;Sore;Throbbing Pain Intervention(s): Monitored during session;Repositioned    Home Living                      Prior Function  PT Goals (current goals can now be found in the care plan section) Acute Rehab PT Goals PT Goal Formulation: With patient Time For Goal Achievement: 09/20/19 Potential to Achieve Goals: Good Progress towards PT goals: Progressing toward goals    Frequency    Min 5X/week      PT Plan Current plan remains appropriate    Co-evaluation              AM-PAC PT "6 Clicks" Mobility   Outcome Measure  Help needed turning from your back to your side while in a flat bed without using bedrails?: None Help needed  moving from lying on your back to sitting on the side of a flat bed without using bedrails?: None Help needed moving to and from a bed to a chair (including a wheelchair)?: None Help needed standing up from a chair using your arms (e.g., wheelchair or bedside chair)?: None Help needed to walk in hospital room?: None Help needed climbing 3-5 steps with a railing? : A Lot 6 Click Score: 22    End of Session Equipment Utilized During Treatment: Back brace;Gait belt Activity Tolerance: Patient tolerated treatment well Patient left: in bed;with call bell/phone within reach;with family/visitor present Nurse Communication: Mobility status PT Visit Diagnosis: Other abnormalities of gait and mobility (R26.89);Pain;Muscle weakness (generalized) (M62.81) Pain - part of body: (back)     Time: 1001-1015 PT Time Calculation (min) (ACUTE ONLY): 14 min  Charges:  $Gait Training: 8-22 mins                     Anastasio Champion, DPT  Acute Rehabilitation Services Pager [phone] Office [place] 09/14/2019, 1:02 PM

## 2019-09-14 NOTE — Evaluation (Signed)
Occupational Therapy Evaluation Patient Details Name: Terri Alvarado MRN: [id number] DOB: [birth year] Today's Date: 09/14/2019    History of Present Illness pt is a 64 y/o female with debilitating back, buttoch and radicular leg pain.  Pt is s/p stage 1 ALIF at L5, S1 and LIF at L 3-5 with a wealth of instramentation.  Stage 2, kyphoplasty for L3 compression fx and PLIF L3-S1.  PMhx: Deg scoliosis, HTN, lumbar radiculopathy, spondylosis, claudication.   Clinical Impression   PTA patient independent and working. Admitted for above and is limited by problem list below, including pain, back precautions, impaired balance, and decreased activity tolerance.  She was educated on back precautions, brace mgmt and wear schedule, activity progression, ADL compensatory techniques, AE/DME recommendations, and safety.  She will have support of family at dc, who can assist as needed.  Today, she requires mod-max assist for LB ADLs, min assist for UB ADLs, min assist for transfers and in room mobility.  She will benefit from continued OT services while admitted to optimize independence, safety and activity tolerance with ADLs, mobility but anticipate no further needs after dc home.      Follow Up Recommendations  No OT follow up;Supervision/Assistance - 24 hour    Equipment Recommendations  3 in 1 bedside commode    Recommendations for Other Services       Precautions / Restrictions Precautions Precautions: Back Precaution Booklet Issued: No(PT will issue during session) Precaution Comments: reviewed back precautions with pt, good recall Required Braces or Orthoses: Spinal Brace Spinal Brace: Lumbar corset;Applied in sitting position Restrictions Weight Bearing Restrictions: No      Mobility Bed Mobility Overal bed mobility: Needs Assistance Bed Mobility: Sidelying to Sit;Sit to Sidelying   Sidelying to sit: Min assist     Sit to sidelying: Min assist General bed mobility comments: min  assist for safety and scooting hips (already in sidelying), cueing for technique; heavy use of rails  Transfers Overall transfer level: Needs assistance Equipment used: Rolling walker (2 wheeled) Transfers: Sit to/from Stand Sit to Stand: Min assist         General transfer comment: min assist to power up and steady, cueing for hand placement, technique and body mechanics    Balance Overall balance assessment: Mild deficits observed, not formally tested                                         ADL either performed or assessed with clinical judgement   ADL Overall ADL's : Needs assistance/impaired     Grooming: Wash/dry hands;Min guard;Standing   Upper Body Bathing: Set up;Sitting   Lower Body Bathing: Sit to/from stand;Moderate assistance   Upper Body Dressing : Minimal assistance;Sitting   Lower Body Dressing: Maximal assistance;Sit to/from stand Lower Body Dressing Details (indicate cue type and reason): pt unable to complete figure 4 technique, min assist sit to stand  Toilet Transfer: Minimal assistance;Ambulation;Grab bars;RW Statistician Details (indicate cue type and reason): cueing for hand placement, safety, body mechanics  Toileting- Clothing Manipulation and Hygiene: Minimal assistance;Sit to/from stand;Cueing for compensatory techniques;Cueing for back precautions     Tub/Shower Transfer Details (indicate cue type and reason): pt reports plan to basin bathe for 2 weeks  Functional mobility during ADLs: Minimal assistance;Rolling walker General ADL Comments: patient educated on brace mgmt/wear schedule, back precautions, safety, ADl compensatory techniques, DME/AE and recommendations  Vision         Perception     Praxis      Pertinent Vitals/Pain Pain Assessment: 0-10 Pain Score: 6  Pain Location: incision, back Pain Descriptors / Indicators: Burning;Grimacing;Guarding;Sore;Throbbing Pain Intervention(s): Monitored during  session;Limited activity within patient's tolerance;Repositioned     Hand Dominance Right   Extremity/Trunk Assessment Upper Extremity Assessment Upper Extremity Assessment: Overall WFL for tasks assessed(limited R shoulder ROM with hx of recent shoulder surgery)   Lower Extremity Assessment Lower Extremity Assessment: Defer to PT evaluation   Cervical / Trunk Assessment Cervical / Trunk Assessment: Other exceptions Cervical / Trunk Exceptions: s/p back surgery   Communication Communication Communication: No difficulties   Cognition Arousal/Alertness: Awake/alert Behavior During Therapy: WFL for tasks assessed/performed Overall Cognitive Status: Within Functional Limits for tasks assessed                                     General Comments  daughter present and supportive    Exercises     Shoulder Instructions      Home Living Family/patient expects to be discharged to:: Private residence Living Arrangements: Spouse/significant other;Children;Other (Comment)(going to son and daughter in laws home ) Available Help at Discharge: Family;Available 24 hours/day Type of Home: House Home Access: Stairs to enter [employer] of Steps: 2 Entrance Stairs-Rails: Right;Left Home Layout: One level     Bathroom Shower/Tub: Tub/shower unit;Walk-in shower   Bathroom Toilet: Standard Bathroom Accessibility: Yes   Home Equipment: Walker - 2 wheels          Prior Functioning/Environment Level of Independence: Independent        Comments: working still        OT Problem List: Decreased strength;Decreased activity tolerance;Impaired balance (sitting and/or standing);Decreased safety awareness;Decreased knowledge of use of DME or AE;Decreased knowledge of precautions;Pain      OT Treatment/Interventions: Self-care/ADL training;DME and/or AE instruction;Therapeutic activities;Cognitive remediation/compensation;Patient/family education;Balance  training    OT Goals(Current goals can be found in the care plan section) Acute Rehab OT Goals Patient Stated Goal: less pain, to get home  OT Goal Formulation: With patient Time For Goal Achievement: 09/28/19 Potential to Achieve Goals: Good  OT Frequency: Min 2X/week   Barriers to D/C:            Co-evaluation              AM-PAC OT "6 Clicks" Daily Activity     Outcome Measure Help from another person eating meals?: None Help from another person taking care of personal grooming?: A Little Help from another person toileting, which includes using toliet, bedpan, or urinal?: A Little Help from another person bathing (including washing, rinsing, drying)?: A Lot Help from another person to put on and taking off regular upper body clothing?: A Little Help from another person to put on and taking off regular lower body clothing?: A Lot 6 Click Score: 17   End of Session Equipment Utilized During Treatment: Gait belt;Rolling walker;Back brace Nurse Communication: Mobility status;Precautions  Activity Tolerance: Patient tolerated treatment well Patient left: in bed;with call bell/phone within reach;with family/visitor present  OT Visit Diagnosis: Other abnormalities of gait and mobility (R26.89);Muscle weakness (generalized) (M62.81);Pain Pain - part of body: (back)                Time: 2440-1027 OT Time Calculation (min): 20 min Charges:  OT General Charges $OT Visit: 1 Visit OT Evaluation $  OT Eval Low Complexity: 1 Low  Jolaine Artist, OT Acute Rehabilitation Services Pager [phone] Office [phone]   Delight Stare 09/14/2019, 10:02 AM

## 2019-09-14 NOTE — Progress Notes (Signed)
    Subjective  - POD #1,2  Complaining from pain in her back at her incision areas.  She denies any abdominal pain. Positive flatus   Physical Exam:  Incision is healing nicely.  Extremities are warm well perfused with palpable pulses.  No significant edema.       Assessment/Plan:  POD #1,2  Doing well from vascular perspective.  No signs of arterial or venous compromise to the legs.  Continue to mobilize and increase diet.  Lower extremity venous Dopplers pending.  Wells Marin Milley 09/14/2019 7:51 AM --  Vitals:   09/14/19 0532 09/14/19 0727  BP: (!) 91/59 (!) 97/56  Pulse: 88 76  Resp: 18 18  Temp: 98.4 F (36.9 C) 98.9 F (37.2 C)  SpO2: 96% 95%    Intake/Output Summary (Last 24 hours) at 09/14/2019 0751 Last data filed at 09/13/2019 1614 Gross per 24 hour  Intake 1180 ml  Output 675 ml  Net 505 ml     Laboratory CBC    Component Value Date/Time   WBC 9.3 09/13/2019 0611   HGB 7.7 (L) 09/13/2019 0611   HCT 27.4 (L) 09/13/2019 0611   PLT 186 09/13/2019 0611    BMET    Component Value Date/Time   NA 139 09/12/2019 1426   K 3.8 09/12/2019 1426   CL 107 09/12/2019 1426   CO2 21 (L) 09/10/2019 1100   GLUCOSE 159 (H) 09/12/2019 1426   BUN 16 09/12/2019 1426   CREATININE 0.81 09/13/2019 1442   CALCIUM 9.3 09/10/2019 1100   GFRNONAA >60 09/13/2019 1442   GFRAA >60 09/13/2019 1442    COAG Lab Results  Component Value Date   INR 0.9 09/10/2019   No results found for: PTT  Antibiotics Anti-infectives (From admission, onward)   Start     Dose/Rate Route Frequency Ordered Stop   09/13/19 1400  ceFAZolin (ANCEF) IVPB 1 g/50 mL premix     1 g 100 mL/hr over 30 Minutes Intravenous Every 8 hours 09/13/19 1400 09/13/19 2158   09/12/19 2100  ceFAZolin (ANCEF) IVPB 1 g/50 mL premix     1 g 100 mL/hr over 30 Minutes Intravenous Every 8 hours 09/12/19 1633 09/13/19 1425   09/12/19 0638  ceFAZolin (ANCEF) IVPB 2g/100 mL premix     2 g 200 mL/hr over 30  Minutes Intravenous 30 min pre-op 09/12/19 0638 09/12/19 1310       V. Charlena Cross, M.D., [facility] [place] Office: [phone] Pager:  [phone]

## 2019-09-14 NOTE — Progress Notes (Signed)
    Subjective: Procedure(s) (LRB): POSTERIOR LUMBAR FUSION INTERBODY LUMBAR THREE - SACRAL ONE, LUMBAR THREE KYPHOPLASTY (N/A) 1 Day Post-Op  Patient reports pain as 5 on 0-10 scale.  Reports decreased leg pain reports incisional back pain   Positive void Negative bowel movement Positive flatus Negative chest pain or shortness of breath  Objective: Vital signs in last 24 hours: Temp:  [97 F (36.1 C)-99.5 F (37.5 C)] 98.4 F (36.9 C) (05/14 0532) Pulse Rate:  [78-103] 88 (05/14 0532) Resp:  [11-24] 18 (05/14 0532) BP: (91-130)/(54-76) 91/59 (05/14 0532) SpO2:  [88 %-100 %] 96 % (05/14 0532)  Intake/Output from previous day: 05/13 0701 - 05/14 0700 In: 1180 [I.V.:500; Blood:630; IV Piggyback:50] Out: 675 [Urine:575; Blood:100]  Labs: Recent Labs    09/12/19 2123 09/13/19 0611  WBC 8.7 9.3  RBC 3.81* 3.43*  HCT 29.1* 27.4*  PLT 192 186   Recent Labs    09/12/19 1009 09/12/19 1009 09/12/19 1109 09/12/19 1426 09/13/19 1442  NA 137   < > 137 139  --   K 4.2   < > 4.0 3.8  --   CL 104  --   --  107  --   BUN 18  --   --  16  --   CREATININE 0.60   < >  --  0.60 0.81  GLUCOSE 138*  --   --  159*  --    < > = values in this interval not displayed.   No results for input(s): LABPT, INR in the last 72 hours.  Physical Exam: Neurologically intact ABD soft Intact pulses distally Incision: dressing C/D/I and no drainage Compartment soft Body mass index is 27.88 kg/m.   Assessment/Plan: Patient stable  xrays n/a Continue mobilization with physical therapy Continue care  1.  Postoperative anemia from acute blood loss.  Patient has responded to 2 units of packed red blood cells given the time of surgery.  We'll recheck her rate this morning and continue iron.  Patient did have preoperative anemia. 2.  Continue to encourage mobilization with physical therapy. 3.  Doppler to rule out lower extremity DVT will be done today. 4.  Continue mobilization with  physical therapy. 5.  We'll start Lovenox later on today for DVT prevention. 6.  Plan on discharge tomorrow.   Venita Lick, MD [facility] [phone]

## 2019-09-15 NOTE — Progress Notes (Signed)
Occupational Therapy Treatment Patient Details Name: Terri Alvarado MRN: [id number] DOB: [birth year] Today's Date: 09/15/2019    History of present illness Pt is a 64 y/o female with debilitating back, buttock and radicular leg pain.  Pt is s/p stage 1 ALIF at L5, S1 and LIF at L 3-5 with a wealth of instramentation.  Stage 2, kyphoplasty for L3 compression fx and PLIF L3-S1.  PMhx: Deg scoliosis, HTN, lumbar radiculopathy, spondylosis, claudication.   OT comments  Patient is s/p see above surgery resulting in the deficits listed below (see OT Problem List). Pt presented in bed and required use of increase time and bed rail for supine to sitting and min guard from elevated surface sit to stand. Pt requires long handle reacher for LE dressing and moderate assistance and set up to supervision with UE dressing with cues to follow precautions. Back handout reviewed adls in detail. Pt educated on: clothing between brace, correct bed positioning for sleeping, correct sequence for bed mobility, avoiding lifting more than 5 pounds and never wash directly over incision.   Follow Up Recommendations  No OT follow up;Supervision/Assistance - 24 hour    Equipment Recommendations  3 in 1 bedside commode    Recommendations for Other Services      Precautions / Restrictions Precautions Precautions: Back Precaution Booklet Issued: (already has) Precaution Comments: reviewed back precautions with pt, good recall Required Braces or Orthoses: Spinal Brace Spinal Brace: Lumbar corset;Applied in sitting position Restrictions Weight Bearing Restrictions: No       Mobility Bed Mobility Overal bed mobility: Needs Assistance Bed Mobility: Rolling;Sidelying to Sit;Sit to Sidelying Rolling: Supervision Sidelying to sit: Supervision     Sit to sidelying: Supervision(elevated surface)    Transfers Overall transfer level: Needs assistance Equipment used: Rolling walker (2 wheeled) Transfers: Sit to/from  Stand Sit to Stand: Min guard         General transfer comment: good hand placement and technique, min guard for safety    Balance Overall balance assessment: Needs assistance Sitting-balance support: Feet supported Sitting balance-Leahy Scale: Good     Standing balance support: During functional activity;Bilateral upper extremity supported Standing balance-Leahy Scale: Fair                             ADL either performed or assessed with clinical judgement   ADL Overall ADL's : Needs assistance/impaired Eating/Feeding: Independent;Sitting   Grooming: Wash/dry hands;Wash/dry face;Supervision/safety   Upper Body Bathing: Set up;Sitting   Lower Body Bathing: Sit to/from stand;Moderate assistance   Upper Body Dressing : Supervision/safety;Sitting   Lower Body Dressing: Moderate assistance;Sit to/from stand Lower Body Dressing Details (indicate cue type and reason): decrease in LE strength and requiring AE Toilet Transfer: Minimal assistance;Ambulation;Grab bars;RW Statistician Details (indicate cue type and reason): cueing for hand placement, safety, body mechanics  Toileting- Clothing Manipulation and Hygiene: Minimal assistance;Sit to/from stand;Cueing for compensatory techniques;Cueing for back precautions     Tub/Shower Transfer Details (indicate cue type and reason): pt reports plan to basin bathe for 2 weeks  Functional mobility during ADLs: Minimal assistance;Rolling walker       Vision       Perception     Praxis      Cognition Arousal/Alertness: Awake/alert Behavior During Therapy: WFL for tasks assessed/performed Overall Cognitive Status: Within Functional Limits for tasks assessed  Exercises     Shoulder Instructions       General Comments      Pertinent Vitals/ Pain       Pain Assessment: 0-10 Pain Score: 8  Pain Location: incision, back Pain Descriptors /  Indicators: Burning;Grimacing;Guarding;Sore;Throbbing  Home Living                                          Prior Functioning/Environment              Frequency  Min 2X/week        Progress Toward Goals  OT Goals(current goals can now be found in the care plan section)  Progress towards OT goals: Progressing toward goals  Acute Rehab OT Goals Patient Stated Goal: less pain, to get home  OT Goal Formulation: With patient Time For Goal Achievement: 09/28/19 Potential to Achieve Goals: Good ADL Goals Pt Will Perform Grooming: with supervision;standing Pt Will Perform Lower Body Bathing: with supervision;with adaptive equipment;sit to/from stand;sitting/lateral leans Pt Will Perform Lower Body Dressing: with supervision;sit to/from stand;with adaptive equipment Pt Will Transfer to Toilet: with supervision;bedside commode;ambulating Pt Will Perform Toileting - Clothing Manipulation and hygiene: with supervision;sit to/from stand  Plan Discharge plan remains appropriate    Co-evaluation                 AM-PAC OT "6 Clicks" Daily Activity     Outcome Measure   Help from another person eating meals?: None Help from another person taking care of personal grooming?: A Little Help from another person toileting, which includes using toliet, bedpan, or urinal?: A Little Help from another person bathing (including washing, rinsing, drying)?: A Lot Help from another person to put on and taking off regular upper body clothing?: A Little Help from another person to put on and taking off regular lower body clothing?: A Little 6 Click Score: 18    End of Session Equipment Utilized During Treatment: Gait belt;Rolling walker;Back brace  OT Visit Diagnosis: Other abnormalities of gait and mobility (R26.89);Muscle weakness (generalized) (M62.81);Pain Pain - part of body: (back)   Activity Tolerance Patient tolerated treatment well   Patient Left in bed;with  call bell/phone within reach   Nurse Communication Patient requests pain meds        Time: 0742-0800 OT Time Calculation (min): 18 min  Charges: OT General Charges $OT Visit: 1 Visit  Joeseph Amor OTR/L  Acute Rehab Services  [phone] office number [phone] pager number    Joeseph Amor 09/15/2019, 8:45 AM

## 2019-09-15 NOTE — TOC Transition Note (Signed)
Transition of Care [facility]) - CM/SW Discharge Note   Patient Details  Name: BREA COLESON MRN: [id number] Date of Birth: October 10, 1955  Transition of Care [facility]) CM/SW Contact:  Deveron Furlong, RN 09/15/2019, 10:50 AM   Clinical Narrative:    Patient to d/c home with [facility].  Patient presented with Medicare list and has no preference of agency.  Patient agreeable to [facility].    3n1 provided by [facility] staff.    Final next level of care: Home w Home Health Services Barriers to Discharge: No Barriers Identified   Patient Goals and CMS Choice Patient states their goals for this hospitalization and ongoing recovery are:: to get home CMS Medicare.gov Compare Post Acute Care list provided to:: Patient Choice offered to / list presented to : Patient  Discharge Plan and Services                DME Arranged: 3-N-1 DME Agency: [facility]     Representative spoke with at DME Agency: by [facility] staff HH Arranged: PT, OT, Nurse's Aide HH Agency: [facility] Date HH Agency Contacted: 09/15/19 Time HH Agency Contacted: 1049 Representative spoke with at [facility] Agency: Barbara Cower

## 2019-09-15 NOTE — Progress Notes (Signed)
   Subjective: 2 Days Post-Op Procedure(s) (LRB): POSTERIOR LUMBAR FUSION INTERBODY LUMBAR THREE - SACRAL ONE, LUMBAR THREE KYPHOPLASTY (N/A) Patient reports pain as mild.   Patient seen in rounds for Dr. Shon Baton. Patient is well, and has had no acute complaints or problems other than incisional back pain. Voiding without difficulty. Positive flatus.  Plan is to go Home after hospital stay.  Objective: Vital signs in last 24 hours: Temp:  [97.8 F (36.6 C)-102.2 F (39 C)] 98.7 F (37.1 C) (05/15 0738) Pulse Rate:  [86-104] 87 (05/15 0738) Resp:  [18-20] 18 (05/15 0738) BP: (95-112)/(48-63) 101/61 (05/15 0738) SpO2:  [94 %-98 %] 95 % (05/15 0738)  Intake/Output from previous day: No intake or output data in the 24 hours ending 09/15/19 0934  Intake/Output this shift: No intake/output data recorded.  Labs: Recent Labs    09/12/19 1426 09/12/19 2123 09/13/19 0611 09/13/19 1011 09/14/19 0745  HGB 7.1* 8.2* 7.7* 9.2* 8.5*   Recent Labs    09/13/19 0611 09/13/19 0611 09/13/19 1011 09/14/19 0745  WBC 9.3  --   --  8.2  RBC 3.43*  --   --  3.44*  HCT 27.4*   < > 27.0* 27.8*  PLT 186  --   --  155   < > = values in this interval not displayed.   Recent Labs    09/12/19 1426 09/12/19 1426 09/13/19 1011 09/13/19 1442  NA 139  --  135  --   K 3.8  --  3.9  --   CL 107  --  101  --   BUN 16  --  15  --   CREATININE 0.60   < > 0.60 0.81  GLUCOSE 159*  --  138*  --    < > = values in this interval not displayed.   No results for input(s): LABPT, INR in the last 72 hours.  Exam: General - Patient is Alert and Oriented Extremity - Neurologically intact ABD soft Intact pulses distally Dressing/Incision - clean, dry, no drainage Motor Function - intact, moving foot and toes well on exam.   Past Medical History:  Diagnosis Date  . Anxiety   . Degeneration of lumbar intervertebral disc   . Degenerative scoliosis   . Depression   . GERD (gastroesophageal reflux  disease)   . Hypertension   . Low back pain   . Lumbar radiculopathy   . Lumbar spondylosis   . Neurogenic claudication   . Pain in joint of right shoulder   . Pain of left hip joint   . Rotator cuff arthropathy of right shoulder   . Spinal stenosis     Assessment/Plan: 2 Days Post-Op Procedure(s) (LRB): POSTERIOR LUMBAR FUSION INTERBODY LUMBAR THREE - SACRAL ONE, LUMBAR THREE KYPHOPLASTY (N/A) Active Problems:   Fusion of lumbar spine  Estimated body mass index is 27.88 kg/m as calculated from the following:   Height as of this encounter: 5\' 3"  (1.6 m).   Weight as of this encounter: 71.4 kg.  Doppler was negative for DVT Hemoglobin 8.5 this AM. Discharge to home today.  , PA-C Orthopedic Surgery [phone] 09/15/2019, 9:34 AM

## 2019-09-15 NOTE — Plan of Care (Signed)
  Problem: Pain Management: Goal: Pain level will decrease Outcome: Completed/Met   Problem: Health Behavior/Discharge Planning: Goal: Identification of resources available to assist in meeting health care needs will improve Outcome: Completed/Met   Problem: Activity: Goal: Ability to avoid complications of mobility impairment will improve Outcome: Completed/Met Goal: Ability to tolerate increased activity will improve Outcome: Completed/Met Goal: Will remain free from falls Outcome: Completed/Met   Problem: Bowel/Gastric: Goal: Gastrointestinal status for postoperative course will improve Outcome: Completed/Met   Problem: Clinical Measurements: Goal: Ability to maintain clinical measurements within normal limits will improve Outcome: Completed/Met Goal: Postoperative complications will be avoided or minimized Outcome: Completed/Met Goal: Diagnostic test results will improve Outcome: Completed/Met   Problem: Pain Management: Goal: Pain level will decrease Outcome: Completed/Met   Problem: Skin Integrity: Goal: Will show signs of wound healing Outcome: Completed/Met   Problem: Health Behavior/Discharge Planning: Goal: Identification of resources available to assist in meeting health care needs will improve Outcome: Completed/Met   Problem: Bladder/Genitourinary: Goal: Urinary functional status for postoperative course will improve Outcome: Completed/Met

## 2019-09-15 NOTE — Progress Notes (Signed)
Physical Therapy Treatment Patient Details Name: Terri Alvarado MRN: [id number] DOB: 05-06-1955 Today's Date: 09/15/2019    History of Present Illness Pt is a 64 y/o female with debilitating back, buttock and radicular leg pain.  Pt is s/p stage 1 ALIF at L5, S1 and LIF at L 3-5 with a wealth of instramentation.  Stage 2, kyphoplasty for L3 compression fx and PLIF L3-S1.  PMhx: Deg scoliosis, HTN, lumbar radiculopathy, spondylosis, claudication.    PT Comments    Pt making steady progress with functional mobility as indicated by her ability to ambulate a further distance and participate in stair training this session. Plan is for pt to d/c home today with family support. Pt would continue to benefit from skilled physical therapy services at this time while admitted and after d/c to address the below listed limitations in order to improve overall safety and independence with functional mobility.    Follow Up Recommendations  Home health PT     Equipment Recommendations  None recommended by PT    Recommendations for Other Services       Precautions / Restrictions Precautions Precautions: Back Precaution Booklet Issued: (already has) Precaution Comments: reviewed back precautions with pt, good recall Required Braces or Orthoses: Spinal Brace Spinal Brace: Lumbar corset;Applied in sitting position Restrictions Weight Bearing Restrictions: No    Mobility  Bed Mobility Overal bed mobility: Needs Assistance Bed Mobility: Sidelying to Sit Rolling: Supervision Sidelying to sit: Min assist     Sit to sidelying: Supervision(elevated surface) General bed mobility comments: min A for management of LEs off of bed  Transfers Overall transfer level: Needs assistance Equipment used: Rolling walker (2 wheeled) Transfers: Sit to/from Stand Sit to Stand: Min guard         General transfer comment: good hand placement and technique, min guard for  safety  Ambulation/Gait Ambulation/Gait assistance: Min guard Gait Distance (Feet): 100 Feet Assistive device: Rolling walker (2 wheeled) Gait Pattern/deviations: Step-through pattern;Decreased stride length Gait velocity: decreased   General Gait Details: pt with slow, steady gait with use of RW; no LOB or need for physical assistance   Stairs Stairs: Yes Stairs assistance: Min assist Stair Management: One rail Right;Step to pattern;Forwards Number of Stairs: 2 General stair comments: pt using unilateral hand rail on R to simulate home set up. PT provided HHA on L and min A for support   Wheelchair Mobility    Modified Rankin (Stroke Patients Only)       Balance Overall balance assessment: Needs assistance Sitting-balance support: Feet supported Sitting balance-Leahy Scale: Good     Standing balance support: During functional activity;Bilateral upper extremity supported Standing balance-Leahy Scale: Poor                              Cognition Arousal/Alertness: Awake/alert Behavior During Therapy: WFL for tasks assessed/performed Overall Cognitive Status: Within Functional Limits for tasks assessed                                        Exercises      General Comments        Pertinent Vitals/Pain Pain Assessment: Faces Pain Score: 8  Faces Pain Scale: Hurts even more Pain Location: incision, back Pain Descriptors / Indicators: Burning;Grimacing;Guarding;Sore;Throbbing Pain Intervention(s): Monitored during session;Repositioned;Patient requesting pain meds-RN notified    Home Living  Prior Function            PT Goals (current goals can now be found in the care plan section) Acute Rehab PT Goals Patient Stated Goal: less pain, to get home  PT Goal Formulation: With patient Time For Goal Achievement: 09/20/19 Potential to Achieve Goals: Good Progress towards PT goals: Progressing toward  goals    Frequency    Min 5X/week      PT Plan Current plan remains appropriate    Co-evaluation              AM-PAC PT "6 Clicks" Mobility   Outcome Measure  Help needed turning from your back to your side while in a flat bed without using bedrails?: None Help needed moving from lying on your back to sitting on the side of a flat bed without using bedrails?: A Little Help needed moving to and from a bed to a chair (including a wheelchair)?: None Help needed standing up from a chair using your arms (e.g., wheelchair or bedside chair)?: None Help needed to walk in hospital room?: None Help needed climbing 3-5 steps with a railing? : A Little 6 Click Score: 22    End of Session Equipment Utilized During Treatment: Back brace;Gait belt Activity Tolerance: Patient tolerated treatment well Patient left: in chair;with call bell/phone within reach Nurse Communication: Mobility status;Patient requests pain meds PT Visit Diagnosis: Other abnormalities of gait and mobility (R26.89);Pain Pain - part of body: (back)     Time: 3710-6269 PT Time Calculation (min) (ACUTE ONLY): 15 min  Charges:  $Gait Training: 8-22 mins                     Anastasio Champion, DPT  Acute Rehabilitation Services Pager [phone] Office [place] 09/15/2019, 9:15 AM

## 2019-09-17 NOTE — Discharge Summary (Signed)
Patient ID: Terri Alvarado MRN: [id number] DOB/AGE: [birth year] 64 y.o.  Admit date: 09/12/2019 Discharge date: 09/17/2019  Admission Diagnoses:  Active Problems:   Fusion of lumbar spine   Discharge Diagnoses:  Active Problems:   Fusion of lumbar spine  status post Procedure(s): ANTERIOR LUMBAR FUSION LUMBAR FIVE-SACRAL ONE  ANTERIOR LATERAL LUMBAR FUSION LUMBAR THREE-LUMBAR FIVE  POSTERIOR LUMBAR FUSION INTERBODY LUMBAR THREE - SACRAL ONE, LUMBAR THREE KYPHOPLASTY  Past Medical History:  Diagnosis Date  . Anxiety   . Degeneration of lumbar intervertebral disc   . Degenerative scoliosis   . Depression   . GERD (gastroesophageal reflux disease)   . Hypertension   . Low back pain   . Lumbar radiculopathy   . Lumbar spondylosis   . Neurogenic claudication   . Pain in joint of right shoulder   . Pain of left hip joint   . Rotator cuff arthropathy of right shoulder   . Spinal stenosis     Surgeries: Procedure(s): ANTERIOR LUMBAR FUSION LUMBAR FIVE-SACRAL ONE  ANTERIOR LATERAL LUMBAR FUSION LUMBAR THREE-LUMBAR FIVE  POSTERIOR LUMBAR FUSION INTERBODY LUMBAR THREE - SACRAL ONE, LUMBAR THREE KYPHOPLASTY on 09/13/2019   Consultants: Treatment Team:  Serafina Mitchell, MD  Discharged Condition: Improved  Hospital Course: Terri Alvarado is an 64 y.o. female who was admitted 09/12/2019 for operative treatment of Degenerative scoliosis, failed back syndrome with radiculopathy, L3 actue compression fracture. Patient failed conservative treatments (please see the history and physical for the specifics) and had severe unremitting pain that affects sleep, daily activities and work/hobbies. After pre-op clearance, the patient was taken to the operating room on 09/13/2019 and underwent  Procedure(s): POSTERIOR LUMBAR FUSION INTERBODY LUMBAR THREE - SACRAL ONE, LUMBAR THREE KYPHOPLASTY.    Patient was given perioperative antibiotics:  Anti-infectives (From admission, onward)   Start      Dose/Rate Route Frequency Ordered Stop   09/13/19 1400  ceFAZolin (ANCEF) IVPB 1 g/50 mL premix     1 g 100 mL/hr over 30 Minutes Intravenous Every 8 hours 09/13/19 1400 09/14/19 0815   09/12/19 2100  ceFAZolin (ANCEF) IVPB 1 g/50 mL premix     1 g 100 mL/hr over 30 Minutes Intravenous Every 8 hours 09/12/19 1633 09/13/19 1425   09/12/19 0638  ceFAZolin (ANCEF) IVPB 2g/100 mL premix     2 g 200 mL/hr over 30 Minutes Intravenous 30 min pre-op 09/12/19 3903 09/12/19 1310       Patient was given sequential compression devices and early ambulation to prevent DVT.   Patient benefited maximally from hospital stay and there were no complications. At the time of discharge, the patient was urinating/moving their bowels without difficulty, tolerating a regular diet, pain is controlled with oral pain medications and they have been cleared by PT/OT.   Recent vital signs: No data found.   Recent laboratory studies: No results for input(s): WBC, HGB, HCT, PLT, NA, K, CL, CO2, BUN, CREATININE, GLUCOSE, INR, CALCIUM in the last 72 hours.  Invalid input(s): PT, 2   Discharge Medications:   Allergies as of 09/15/2019   No Known Allergies     Medication List    STOP taking these medications   cyclobenzaprine 10 MG tablet Commonly known as: FLEXERIL   naproxen 500 MG tablet Commonly known as: Naprosyn   oxyCODONE-acetaminophen 5-325 MG tablet Commonly known as: Percocet Replaced by: oxyCODONE-acetaminophen 10-325 MG tablet     TAKE these medications   Cymbalta 60 MG capsule Generic drug: DULoxetine Take 60  mg by mouth daily.   enoxaparin 40 MG/0.4ML injection Commonly known as: LOVENOX Inject 0.4 mLs (40 mg total) into the skin daily for 10 days. 10 day supply 1 injection per day   ferrous sulfate 325 (65 FE) MG tablet Take 1 tablet (325 mg total) by mouth 3 (three) times daily with meals.   gabapentin 300 MG capsule Commonly known as: NEURONTIN Take 300-600 mg by mouth daily as  needed (pain).   lisinopril-hydrochlorothiazide 20-12.5 MG tablet Commonly known as: ZESTORETIC Take 1 tablet by mouth daily.   methocarbamol 500 MG tablet Commonly known as: Robaxin Take 1 tablet (500 mg total) by mouth every 8 (eight) hours as needed for up to 5 days for muscle spasms.   omeprazole 40 MG capsule Commonly known as: PRILOSEC Take 40 mg by mouth daily.   ondansetron 4 MG tablet Commonly known as: Zofran Take 1 tablet (4 mg total) by mouth every 8 (eight) hours as needed for nausea or vomiting.   oxyCODONE-acetaminophen 10-325 MG tablet Commonly known as: Percocet Take 1 tablet by mouth every 6 (six) hours as needed for up to 5 days for pain. Replaces: oxyCODONE-acetaminophen 5-325 MG tablet       Diagnostic Studies: DG Chest 2 View  Result Date: 09/10/2019 CLINICAL DATA:  Preoperative assessment for lumbar surgery hypertension, former smoker EXAM: CHEST - 2 VIEW COMPARISON:  None FINDINGS: Normal heart size, mediastinal contours, and pulmonary vascularity. Lungs clear. No pulmonary infiltrate, pleural effusion or pneumothorax. RIGHT shoulder prosthesis. IMPRESSION: No acute abnormalities. Electronically Signed   By: Ulyses Southward M.D.   On: 09/10/2019 15:58   DG Lumbar Spine 2-3 Views  Result Date: 09/13/2019 CLINICAL DATA:  Surgical posterior fusion extending from L3-S1. EXAM: LUMBAR SPINE - 2-3 VIEW; DG C-ARM 1-60 MIN FLUOROSCOPY TIME:  6 minutes 55 seconds. COMPARISON:  Sep 12, 2019. FINDINGS: Four intraoperative fluoroscopic images were obtained of the lumbar spine. Patient is status post surgical posterior fusion of L3-4, L4-5 and L5-S1 with bilateral intrapedicular screw placement and interbody fusion. Good alignment of vertebral bodies is noted. IMPRESSION: Status post surgical posterior fusion of L3-4, L4-5 and L5-S1. Electronically Signed   By: Lupita Raider M.D.   On: 09/13/2019 13:31   DG Lumbar Spine Complete  Result Date: 09/12/2019 CLINICAL DATA:   L3-4 and L4-5 XLIF and L5-S1 ALIF. EXAM: OPERATIVE LUMBAR SPINE - 2 VIEW COMPARISON:  Lumbar spine CT 07/23/2019. FINDINGS: Three spot images from the C-arm fluoroscopic device, AP and LATERAL views of the lumbar spine are submitted for interpretation post-operatively. Interbody fusion at L3 and L4 with appropriately positioned disc prostheses. L5-S1 ALIF with appropriate positioning of the disc prosthesis. IMPRESSION: L3-4 and L4-5 XLIF and L5-S1 ALIF without apparent complications. Electronically Signed   By: Hulan Saas M.D.   On: 09/12/2019 16:50   DG C-Arm 1-60 Min  Result Date: 09/13/2019 CLINICAL DATA:  Surgical posterior fusion extending from L3-S1. EXAM: LUMBAR SPINE - 2-3 VIEW; DG C-ARM 1-60 MIN FLUOROSCOPY TIME:  6 minutes 55 seconds. COMPARISON:  Sep 12, 2019. FINDINGS: Four intraoperative fluoroscopic images were obtained of the lumbar spine. Patient is status post surgical posterior fusion of L3-4, L4-5 and L5-S1 with bilateral intrapedicular screw placement and interbody fusion. Good alignment of vertebral bodies is noted. IMPRESSION: Status post surgical posterior fusion of L3-4, L4-5 and L5-S1. Electronically Signed   By: Lupita Raider M.D.   On: 09/13/2019 13:31   DG C-Arm 1-60 Min  Result Date: 09/12/2019 CLINICAL DATA:  L3-4 and L4-5 XLIF and L5-S1 ALIF. EXAM: OPERATIVE LUMBAR SPINE - 2 VIEW COMPARISON:  Lumbar spine CT 07/23/2019. FINDINGS: Three spot images from the C-arm fluoroscopic device, AP and LATERAL views of the lumbar spine are submitted for interpretation post-operatively. Interbody fusion at L3 and L4 with appropriately positioned disc prostheses. L5-S1 ALIF with appropriate positioning of the disc prosthesis. IMPRESSION: L3-4 and L4-5 XLIF and L5-S1 ALIF without apparent complications. Electronically Signed   By: Hulan Saas M.D.   On: 09/12/2019 16:50   VAS [place] LOWER EXTREMITY VENOUS (DVT)  Result Date: 09/13/2019  Lower Venous DVTStudy Indications: S/p ALIF.   Comparison Study: No prior study Performing Technologist: Gertie Fey MHA, RDMS, RVT, RDCS  Examination Guidelines: A complete evaluation includes B-mode imaging, spectral Doppler, color Doppler, and power Doppler as needed of all accessible portions of each vessel. Bilateral testing is considered an integral part of a complete examination. Limited examinations for reoccurring indications may be performed as noted. The reflux portion of the exam is performed with the patient in reverse Trendelenburg.  +---------+---------------+---------+-----------+----------+--------------+ RIGHT    CompressibilityPhasicitySpontaneityPropertiesThrombus Aging +---------+---------------+---------+-----------+----------+--------------+ CFV      Full           Yes      Yes                                 +---------+---------------+---------+-----------+----------+--------------+ SFJ      Full                                                        +---------+---------------+---------+-----------+----------+--------------+ FV Prox  Full                                                        +---------+---------------+---------+-----------+----------+--------------+ FV Mid   Full                                                        +---------+---------------+---------+-----------+----------+--------------+ FV DistalFull                                                        +---------+---------------+---------+-----------+----------+--------------+ PFV      Full                                                        +---------+---------------+---------+-----------+----------+--------------+ POP      Full           Yes      Yes                                 +---------+---------------+---------+-----------+----------+--------------+  PTV      Full                                                         +---------+---------------+---------+-----------+----------+--------------+ PERO     Full                                                        +---------+---------------+---------+-----------+----------+--------------+   +---------+---------------+---------+-----------+----------+--------------+ LEFT     CompressibilityPhasicitySpontaneityPropertiesThrombus Aging +---------+---------------+---------+-----------+----------+--------------+ CFV      Full           Yes      Yes                                 +---------+---------------+---------+-----------+----------+--------------+ SFJ      Full                                                        +---------+---------------+---------+-----------+----------+--------------+ FV Prox  Full                                                        +---------+---------------+---------+-----------+----------+--------------+ FV Mid   Full                                                        +---------+---------------+---------+-----------+----------+--------------+ FV DistalFull                                                        +---------+---------------+---------+-----------+----------+--------------+ PFV      Full                                                        +---------+---------------+---------+-----------+----------+--------------+ POP      Full           Yes      Yes                                 +---------+---------------+---------+-----------+----------+--------------+ PTV      Full                                                        +---------+---------------+---------+-----------+----------+--------------+  PERO     Full                                                        +---------+---------------+---------+-----------+----------+--------------+     Summary: RIGHT: - There is no evidence of deep vein thrombosis in the lower extremity.  - No cystic structure found in  the popliteal fossa.  LEFT: - There is no evidence of deep vein thrombosis in the lower extremity.  - No cystic structure found in the popliteal fossa.  *See table(s) above for measurements and observations. Electronically signed by Lemar Livings MD on 09/13/2019 at 5:39:20 PM.    Final    DG OR LOCAL ABDOMEN  Result Date: 09/12/2019 CLINICAL DATA:  Intraoperative case with instrument count issue EXAM: OR LOCAL ABDOMEN COMPARISON:  None. FINDINGS: There are apparent postoperative changes in the lumbar spine with disc spacers and screw and plate fixation device. There are skin staples which may overlie the patient. There are clips in the pelvis. There is no demonstrable needle or surgical instrument evident. There is no bowel dilatation or air-fluid level to suggest bowel obstruction. No free air. IMPRESSION: Postoperative changes. No needle or instrument demonstrable on current examination. Bowel gas pattern unremarkable. These results were called by telephone at the time of interpretation on 09/12/2019 at 3:37 pm to provider Grayland Jack, RN, who verbally acknowledged these results. Electronically Signed   By: Bretta Bang III M.D.   On: 09/12/2019 15:37   DG OR LOCAL ABDOMEN  Result Date: 09/12/2019 CLINICAL DATA:  Status post anterior fusion. EXAM: OR LOCAL ABDOMEN COMPARISON:  None. FINDINGS: The bowel gas pattern is normal. No radio-opaque calculi or other significant radiographic abnormality are seen. Status post anterior fusion of L4-5. No other radiopaque foreign body is noted. IMPRESSION: Status post surgical anterior fusion of L4-5. No other radiopaque foreign body is noted. These results were called by telephone at the time of interpretation on 09/12/2019 at 12:14 pm to provider [place], who verbally acknowledged these results. Electronically Signed   By: Lupita Raider M.D.   On: 09/12/2019 12:15    Discharge Instructions    Incentive spirometry RT   Complete by: As directed    Incentive  spirometry RT   Complete by: As directed       Follow-up Information    [facility].   Specialty: Home Health Services Why: [phone].  Agency will contact you to arrange visits with physical and occupational therapists.           Discharge Plan:  discharge to home  Disposition: stable    Signed: Leonette Monarch Genette Huertas for [facility] PA-C [facility] [phone] 09/17/2019, 8:36 AM

## 2019-11-21 ENCOUNTER — Encounter (INDEPENDENT_AMBULATORY_CARE_PROVIDER_SITE_OTHER): Payer: Self-pay

## 2020-01-03 ENCOUNTER — Ambulatory Visit
Admission: EM | Admit: 2020-01-03 | Discharge: 2020-01-03 | Disposition: A | Discharge status: Home / Self Care | Payer: Medicare Other | Attending: Emergency Medicine | Admitting: Emergency Medicine

## 2020-01-03 ENCOUNTER — Encounter: Payer: Self-pay | Admitting: Emergency Medicine

## 2020-01-03 ENCOUNTER — Other Ambulatory Visit: Payer: Self-pay

## 2020-01-03 DIAGNOSIS — H60521 Acute chemical otitis externa, right ear: Secondary | ICD-10-CM | POA: Diagnosis not present

## 2020-01-03 MED ORDER — NEOMYCIN-POLYMYXIN-HC 3.5-10000-1 OT SUSP
4.0000 [drp] | Freq: Three times a day (TID) | OTIC | 0 refills | Status: DC
Start: 2020-01-03 — End: 2021-02-06

## 2020-01-03 NOTE — ED Provider Notes (Signed)
[facility]   [id number] 01/03/20 Arrival Time: 1408  CC:EAR PAIN  SUBJECTIVE: History from: patient.  Terri Alvarado is a 64 y.o. female presented to the urgent care with a complaint of right ear pain for the past 2 days.  Reports she has tried to use ear wax cleaned up and developed the symptoms thereafter..  Denies a precipitating event, such as swimming or wearing ear plugs.  Patient states the pain is constant and achy in character.  Patient has tried OTC medication without relief.  Symptoms are made worse with lying down.  Denies similar symptoms in the past.    Denies fever, chills, fatigue, sinus pain, rhinorrhea, ear discharge, sore throat, SOB, wheezing, chest pain, nausea, changes in bowel or bladder habits.    ROS: As per HPI.  All other pertinent ROS negative.     Past Medical History:  Diagnosis Date  . Anxiety   . Degeneration of lumbar intervertebral disc   . Degenerative scoliosis   . Depression   . GERD (gastroesophageal reflux disease)   . Hypertension   . Low back pain   . Lumbar radiculopathy   . Lumbar spondylosis   . Neurogenic claudication   . Pain in joint of right shoulder   . Pain of left hip joint   . Rotator cuff arthropathy of right shoulder   . Spinal stenosis    Past Surgical History:  Procedure Laterality Date  . ABDOMINAL EXPOSURE N/A 09/12/2019   Procedure: ABDOMINAL EXPOSURE;  Surgeon: Nada Libman, MD;  Location: [facility] OR;  Service: Vascular;  Laterality: N/A;  . ANTERIOR LUMBAR FUSION N/A 09/12/2019   Procedure: ANTERIOR LUMBAR FUSION LUMBAR FIVE-SACRAL ONE  ANTERIOR LATERAL LUMBAR FUSION LUMBAR THREE-LUMBAR FIVE;  Surgeon: Venita Lick, MD;  Location: [facility] OR;  Service: Orthopedics;  Laterality: N/A;  5 hrs Left sided tap block with exparel Dr. Myra Gianotti to do approach  . BACK SURGERY  02/2019  . CARPAL TUNNEL RELEASE  2017   left hand  . POSTERIOR LUMBAR FUSION 4 LEVEL N/A 09/13/2019   Procedure: POSTERIOR LUMBAR FUSION  INTERBODY LUMBAR THREE - SACRAL ONE, LUMBAR THREE KYPHOPLASTY;  Surgeon: Venita Lick, MD;  Location: [facility] OR;  Service: Orthopedics;  Laterality: N/A;  4 hrs  . REVERSE SHOULDER ARTHROPLASTY Right 05/24/2019   Procedure: REVERSE SHOULDER ARTHROPLASTY SDDC;  Surgeon: Francena Hanly, MD;  Location: [facility] ORS;  Service: Orthopedics;  Laterality: Right;   . SHOULDER SURGERY     left shoulder  . TUBAL LIGATION     No Known Allergies No current facility-administered medications on file prior to encounter.   Current Outpatient Medications on File Prior to Encounter  Medication Sig Dispense Refill  . DULoxetine (CYMBALTA) 60 MG capsule Take 60 mg by mouth daily.     Marland Kitchen enoxaparin (LOVENOX) 40 MG/0.4ML injection Inject 0.4 mLs (40 mg total) into the skin daily for 10 days. 10 day supply 1 injection per day 4 mL 0  . ferrous sulfate 325 (65 FE) MG tablet Take 1 tablet (325 mg total) by mouth 3 (three) times daily with meals. 60 tablet 0  . gabapentin (NEURONTIN) 300 MG capsule Take 300-600 mg by mouth daily as needed (pain).     Marland Kitchen lisinopril-hydrochlorothiazide (ZESTORETIC) 20-12.5 MG tablet Take 1 tablet by mouth daily.     Marland Kitchen omeprazole (PRILOSEC) 40 MG capsule Take 40 mg by mouth daily.     . ondansetron (ZOFRAN) 4 MG tablet Take 1 tablet (4 mg total) by  mouth every 8 (eight) hours as needed for nausea or vomiting. 20 tablet 0   Social History   Socioeconomic History  . Marital status: Married    Spouse name: Not on file  . Number of children: Not on file  . Years of education: Not on file  . Highest education level: Not on file  Occupational History  . Not on file  Tobacco Use  . Smoking status: Former Smoker    Packs/day: 1.00    Years: 35.00    Pack years: 35.00    Types: Cigarettes    Quit date: 06/12/2013    Years since quitting: 6.5  . Smokeless tobacco: Never Used  Vaping Use  . Vaping Use: Never used  Substance and Sexual Activity  . Alcohol use: Yes    Comment:  occassionally  . Drug use: Never  . Sexual activity: Not on file  Other Topics Concern  . Not on file  Social History Narrative  . Not on file   Social Determinants of Health   Financial Resource Strain:   . Difficulty of Paying Living Expenses: Not on file  Food Insecurity:   . Worried About Programme researcher, broadcasting/film/video in the Last Year: Not on file  . Ran Out of Food in the Last Year: Not on file  Transportation Needs:   . Lack of Transportation (Medical): Not on file  . Lack of Transportation (Non-Medical): Not on file  Physical Activity:   . Days of Exercise per Week: Not on file  . Minutes of Exercise per Session: Not on file  Stress:   . Feeling of Stress : Not on file  Social Connections:   . Frequency of Communication with Friends and Family: Not on file  . Frequency of Social Gatherings with Friends and Family: Not on file  . Attends Religious Services: Not on file  . Active Member of Clubs or Organizations: Not on file  . Attends Banker Meetings: Not on file  . Marital Status: Not on file  Intimate Partner Violence:   . Fear of Current or Ex-Partner: Not on file  . Emotionally Abused: Not on file  . Physically Abused: Not on file  . Sexually Abused: Not on file   No family history on file.  OBJECTIVE:  Vitals:   01/03/20 1435  BP: 122/79  Pulse: 78  Resp: 17  Temp: 97.9 F (36.6 C)  SpO2: 98%     Physical Exam Vitals and nursing note reviewed.  Constitutional:      General: She is not in acute distress.    Appearance: Normal appearance. She is normal weight. She is not ill-appearing, toxic-appearing or diaphoretic.  HENT:     Head: Normocephalic.     Right Ear: Tympanic membrane and external ear normal. Swelling and tenderness present. There is no impacted cerumen.     Left Ear: Tympanic membrane, ear canal and external ear normal. There is no impacted cerumen.     Ears:     Comments: Tenderness and swelling in right ear  canal Cardiovascular:     Rate and Rhythm: Normal rate and regular rhythm.     Pulses: Normal pulses.     Heart sounds: Normal heart sounds. No murmur heard.  No friction rub. No gallop.   Pulmonary:     Effort: Pulmonary effort is normal. No respiratory distress.     Breath sounds: Normal breath sounds. No stridor. No wheezing, rhonchi or rales.  Chest:  Chest wall: No tenderness.  Neurological:     Mental Status: She is alert and oriented to person, place, and time.      Imaging: No results found.   ASSESSMENT & PLAN:  1. Acute chemical otitis externa of right ear     Meds ordered this encounter  Medications  . neomycin-polymyxin-hydrocortisone (CORTISPORIN) 3.5-10000-1 OTIC suspension    Sig: Place 4 drops into the right ear 3 (three) times daily.    Dispense:  10 mL    Refill:  0   Discharge instructions Rest and drink plenty of fluids Prescribed Ciprodex eardrop Take medications as directed and to completion Continue to use OTC ibuprofen and/ or tylenol as needed for pain control Follow up with PCP if symptoms persists Return here or go to the ER if you have any new or worsening symptoms   Reviewed expectations re: course of current medical issues. Questions answered. Outlined signs and symptoms indicating need for more acute intervention. Patient verbalized understanding. After Visit Summary given.      Note: This document was prepared using Dragon voice recognition software and may include unintentional dictation errors.    Durward Parcel, FNP 01/03/20 1517

## 2020-01-03 NOTE — Discharge Instructions (Addendum)
Rest and drink plenty of fluids Prescribed Ciprodex eardrop Take medications as directed and to completion Continue to use OTC ibuprofen and/ or tylenol as needed for pain control Follow up with PCP if symptoms persists Return here or go to the ER if you have any new or worsening symptoms

## 2020-01-03 NOTE — ED Triage Notes (Signed)
RT ear pain x 3 days, tried to use wax cleaner. Now has swelling and pain to the ear.

## 2020-01-25 DIAGNOSIS — M797 Fibromyalgia: Secondary | ICD-10-CM | POA: Insufficient documentation

## 2020-11-05 ENCOUNTER — Encounter: Payer: Self-pay | Admitting: Emergency Medicine

## 2020-11-06 ENCOUNTER — Encounter (INDEPENDENT_AMBULATORY_CARE_PROVIDER_SITE_OTHER): Payer: Self-pay | Admitting: *Deleted

## 2020-12-15 ENCOUNTER — Encounter: Payer: Self-pay | Admitting: Emergency Medicine

## 2020-12-15 ENCOUNTER — Ambulatory Visit (INDEPENDENT_AMBULATORY_CARE_PROVIDER_SITE_OTHER): Payer: Medicare Other | Admitting: Emergency Medicine

## 2020-12-15 ENCOUNTER — Other Ambulatory Visit: Payer: Self-pay

## 2020-12-15 DIAGNOSIS — R0600 Dyspnea, unspecified: Secondary | ICD-10-CM

## 2020-12-15 DIAGNOSIS — R911 Solitary pulmonary nodule: Secondary | ICD-10-CM | POA: Diagnosis not present

## 2020-12-15 DIAGNOSIS — R0609 Other forms of dyspnea: Secondary | ICD-10-CM | POA: Insufficient documentation

## 2020-12-15 NOTE — Progress Notes (Signed)
Subjective:    Patient ID: Terri Alvarado, female    DOB: 09/08/1955, 65 y.o.   MRN: [id number]  HPI 65 year old former smoker (35 pack years) with a history of hypertension on lisinopril/HCTZ, lumbar scoliosis and radiculopathy with back pain, GERD. She is referred today for evaluation of of shortness of breath. She reports that she has been having exertional dyspnea about 7 months ago. Happens w stairs, walking 100 ft. She is able to shop. May be a bit better since she started exercising more over the last few months.  She has albuterol on her medication list, uses rarely and didn't seem to help her.  No cough, no wheeze, no CP.    TTE 09/11/20 echocardiogram reviewed, shows normal EF with evidence for diastolic dysfunction, mild aortic regurgitation and dilated left atrium. Cardiac exercise stress test performed 10/21/2020 reviewed, showed poor exercise capacity with no evidence for ischemia.  Normal blood pressure and heart rate response to exercise.  No ST changes CT chest 11/12/20 >> evidence for emphysema, 81mm RLL nodule at major fissure.   Chest x-ray 09/10/2019 reviewed by me was normal  Review of Systems As per HPI  Past Medical History:  Diagnosis Date   Anxiety    Degeneration of lumbar intervertebral disc    Degenerative scoliosis    Depression    GERD (gastroesophageal reflux disease)    Hypertension    Low back pain    Lumbar radiculopathy    Lumbar spondylosis    Neurogenic claudication (HCC)    Pain in joint of right shoulder    Pain of left hip joint    Rotator cuff arthropathy of right shoulder    Spinal stenosis      No family history on file.   Social History   Socioeconomic History   Marital status: Married    Spouse name: Not on file   Number of children: Not on file   Years of education: Not on file   Highest education level: Not on file  Occupational History   Not on file  Tobacco Use   Smoking status: Former    Packs/day: 1.00    Years:  35.00    Pack years: 35.00    Types: Cigarettes    Quit date: 06/12/2013    Years since quitting: 7.5   Smokeless tobacco: Never  Vaping Use   Vaping Use: Never used  Substance and Sexual Activity   Alcohol use: Yes    Comment: occassionally   Drug use: Never   Sexual activity: Not on file  Other Topics Concern   Not on file  Social History Narrative   Not on file   Social Determinants of Health   Financial Resource Strain: Not on file  Food Insecurity: Not on file  Transportation Needs: Not on file  Physical Activity: Not on file  Stress: Not on file  Social Connections: Not on file  Intimate Partner Violence: Not on file     No Known Allergies   Outpatient Medications Prior to Visit  Medication Sig Dispense Refill   albuterol (VENTOLIN HFA) 108 (90 Base) MCG/ACT inhaler SMARTSIG:2 Puff(s) By Mouth 4 Times Daily     DULoxetine (CYMBALTA) 60 MG capsule Take 60 mg by mouth daily.      ferrous sulfate 325 (65 FE) MG EC tablet Take by mouth.     folic acid (FOLVITE) 1 MG tablet Take by mouth.     lisinopril-hydrochlorothiazide (ZESTORETIC) 20-12.5 MG tablet Take 1 tablet by mouth  daily.      pantoprazole (PROTONIX) 40 MG tablet Take by mouth.     enoxaparin (LOVENOX) 40 MG/0.4ML injection Inject 0.4 mLs (40 mg total) into the skin daily for 10 days. 10 day supply 1 injection per day 4 mL 0   ferrous sulfate 325 (65 FE) MG tablet Take 1 tablet (325 mg total) by mouth 3 (three) times daily with meals. 60 tablet 0   gabapentin (NEURONTIN) 300 MG capsule Take 300-600 mg by mouth daily as needed (pain).  (Patient not taking: Reported on 12/15/2020)     neomycin-polymyxin-hydrocortisone (CORTISPORIN) 3.5-10000-1 OTIC suspension Place 4 drops into the right ear 3 (three) times daily. 10 mL 0   omeprazole (PRILOSEC) 40 MG capsule Take 40 mg by mouth daily.  (Patient not taking: Reported on 12/15/2020)     ondansetron (ZOFRAN) 4 MG tablet Take 1 tablet (4 mg total) by mouth every 8  (eight) hours as needed for nausea or vomiting. 20 tablet 0   No facility-administered medications prior to visit.        Objective:   Physical Exam Vitals:   12/15/20 1343  BP: 118/76  Pulse: 76  Temp: 97.9 F (36.6 C)  TempSrc: Oral  SpO2: 96%  Weight: 159 lb 9.6 oz (72.4 kg)  Height: 5\' 3"  (1.6 m)    Gen: Pleasant, well-nourished, in no distress,  normal affect  ENT: No lesions,  mouth clear,  oropharynx clear, no postnasal drip  Neck: No JVD, no stridor  Lungs: No use of accessory muscles, no crackles or wheezing on normal respiration, no wheeze on forced expiration  Cardiovascular: RRR, heart sounds normal, no murmur or gallops, no peripheral edema  Musculoskeletal: No deformities, no cyanosis or clubbing  Neuro: alert, awake, non focal  Skin: Warm, tanned, no lesions or rash      Assessment & Plan:  DOE (dyspnea on exertion) She has had a reassuring cardiac work-up.  Some possible emphysematous change on CT chest from July.  She does have a tobacco history.  Some of this may be deconditioning but I think we need to evaluate for obstructive lung disease.  She may benefit from scheduled bronchodilator therapy going forward.  We will perform pulmonary function testing at her next office visit.  Pulmonary nodule Small 4 mm pulmonary nodule noted on her CT chest from 11/12/2020 at the right lower lobe major fissure.  She does have a tobacco history, should repeat her CT in July 2023.   August 2023, MD, PhD 12/15/2020, 2:06 PM [facility] [phone] or if no answer before 7:00PM call [phone] For any issues after 7:00PM please call eLink [phone]

## 2020-12-15 NOTE — Addendum Note (Signed)
Addended by: Dorisann Frames R on: 12/15/2020 02:26 PM   Modules accepted: Orders

## 2020-12-15 NOTE — Assessment & Plan Note (Signed)
Small 4 mm pulmonary nodule noted on her CT chest from 11/12/2020 at the right lower lobe major fissure.  She does have a tobacco history, should repeat her CT in July 2023.

## 2020-12-15 NOTE — Patient Instructions (Addendum)
We will arrange for pulmonary function testing at your next office visit. It is okay for you to try using your albuterol 2 puffs if needed for shortness of breath or you may want to try taking 2 puffs about 10 minutes before exerting herself. We will plan to repeat CT chest in 10/2021 to follow small pulmonary nodule.  Follow Dr. Delton Coombes next available with PFT on the same day.

## 2020-12-15 NOTE — Assessment & Plan Note (Signed)
She has had a reassuring cardiac work-up.  Some possible emphysematous change on CT chest from July.  She does have a tobacco history.  Some of this may be deconditioning but I think we need to evaluate for obstructive lung disease.  She may benefit from scheduled bronchodilator therapy going forward.  We will perform pulmonary function testing at her next office visit.

## 2021-01-15 ENCOUNTER — Encounter: Payer: Self-pay | Admitting: Emergency Medicine

## 2021-01-15 ENCOUNTER — Other Ambulatory Visit: Payer: Self-pay

## 2021-01-15 ENCOUNTER — Ambulatory Visit (INDEPENDENT_AMBULATORY_CARE_PROVIDER_SITE_OTHER): Payer: Medicare Other | Admitting: Emergency Medicine

## 2021-01-15 DIAGNOSIS — R0600 Dyspnea, unspecified: Secondary | ICD-10-CM

## 2021-01-15 DIAGNOSIS — R911 Solitary pulmonary nodule: Secondary | ICD-10-CM | POA: Diagnosis not present

## 2021-01-15 DIAGNOSIS — R0609 Other forms of dyspnea: Secondary | ICD-10-CM

## 2021-01-15 NOTE — Patient Instructions (Signed)
Full PFT performed today. °

## 2021-01-15 NOTE — Progress Notes (Signed)
Full PFT performed today. °

## 2021-01-15 NOTE — Assessment & Plan Note (Signed)
4 mm pulmonary nodule that will need to be followed given her history of tobacco.  Next scan to be done in July 2023.  Plan to follow-up after

## 2021-01-15 NOTE — Assessment & Plan Note (Signed)
Reassuring cardiac exam and now her pulmonary function testing is grossly normal.  She did have emphysematous change on CT chest but unclear that there is any role to start scheduled bronchodilator therapy.  She has albuterol that she can use if needed.  I think a component of her dyspnea is deconditioning and she is going to start working on her exercise and conditioning.  We will perform walking oximetry on room air to ensure no occult desaturation.

## 2021-01-15 NOTE — Patient Instructions (Signed)
We reviewed your pulmonary function testing today.  These are grossly normal which is good news.  Walking oximetry today on room air Keep albuterol available to use 2 puffs up to every 4 hours if needed for shortness of breath, chest tightness, wheezing.  Try to slowly and steadily increase your exercise and conditioning.  This will help your overall breathing. We will repeat your CT scan of the chest at [facility] in July 2023 to follow your small pulmonary nodule. Follow with Dr. Delton Coombes in July 2023 after your CT scan or sooner if you have any problems.

## 2021-01-15 NOTE — Progress Notes (Signed)
Subjective:    Patient ID: Terri Alvarado, female    DOB: 1955/07/01, 65 y.o.   MRN: [id number]  HPI 65 year old former smoker (35 pack years) with a history of hypertension on lisinopril/HCTZ, lumbar scoliosis and radiculopathy with back pain, GERD. She is referred today for evaluation of of shortness of breath. She reports that she has been having exertional dyspnea about 7 months ago. Happens w stairs, walking 100 ft. She is able to shop. May be a bit better since she started exercising more over the last few months.  She has albuterol on her medication list, uses rarely and didn't seem to help her.  No cough, no wheeze, no CP.    TTE 09/11/20 echocardiogram reviewed, shows normal EF with evidence for diastolic dysfunction, mild aortic regurgitation and dilated left atrium. Cardiac exercise stress test performed 10/21/2020 reviewed, showed poor exercise capacity with no evidence for ischemia.  Normal blood pressure and heart rate response to exercise.  No ST changes CT chest 11/12/20 >> evidence for emphysema, 35mm RLL nodule at major fissure.   Chest x-ray 09/10/2019 reviewed by me was normal    ROV 01/15/21 --follow-up visit 65 year old woman with history tobacco use, hypertension with diastolic dysfunction, scoliosis, GERD.  She has been having exertional shortness of breath.  Had a reassuring cardiac evaluation in June.  We performed pulmonary function testing as below. She is still experiencing exertional SOB, especially w stairs. No cough or wheeze. She does not use her albuterol. She is not as active as she used to be, questions some deconditioning  PFT 01/15/2021 reviewed by me shows grossly normal airflows without a bronchodilator response, some possible mild restriction based on the decreased RV, normal diffusion capacity.   Review of Systems As per HPI  Past Medical History:  Diagnosis Date   Anxiety    Degeneration of lumbar intervertebral disc    Degenerative scoliosis     Depression    GERD (gastroesophageal reflux disease)    Hypertension    Low back pain    Lumbar radiculopathy    Lumbar spondylosis    Neurogenic claudication (HCC)    Pain in joint of right shoulder    Pain of left hip joint    Rotator cuff arthropathy of right shoulder    Spinal stenosis      No family history on file.   Social History   Socioeconomic History   Marital status: Married    Spouse name: Not on file   Number of children: Not on file   Years of education: Not on file   Highest education level: Not on file  Occupational History   Not on file  Tobacco Use   Smoking status: Former    Packs/day: 1.00    Years: 35.00    Pack years: 35.00    Types: Cigarettes    Quit date: 06/12/2013    Years since quitting: 7.6   Smokeless tobacco: Never  Vaping Use   Vaping Use: Never used  Substance and Sexual Activity   Alcohol use: Yes    Comment: occassionally   Drug use: Never   Sexual activity: Not on file  Other Topics Concern   Not on file  Social History Narrative   Not on file   Social Determinants of Health   Financial Resource Strain: Not on file  Food Insecurity: Not on file  Transportation Needs: Not on file  Physical Activity: Not on file  Stress: Not on file  Social Connections: Not  on file  Intimate Partner Violence: Not on file     No Known Allergies   Outpatient Medications Prior to Visit  Medication Sig Dispense Refill   albuterol (VENTOLIN HFA) 108 (90 Base) MCG/ACT inhaler SMARTSIG:2 Puff(s) By Mouth 4 Times Daily     DULoxetine (CYMBALTA) 60 MG capsule Take 60 mg by mouth daily.      ferrous sulfate 325 (65 FE) MG EC tablet Take by mouth.     folic acid (FOLVITE) 1 MG tablet Take by mouth.     gabapentin (NEURONTIN) 300 MG capsule Take 300-600 mg by mouth daily as needed (pain).     lisinopril-hydrochlorothiazide (ZESTORETIC) 20-12.5 MG tablet Take 1 tablet by mouth daily.      omeprazole (PRILOSEC) 40 MG capsule Take 40 mg by mouth  daily.     pantoprazole (PROTONIX) 40 MG tablet Take by mouth.     enoxaparin (LOVENOX) 40 MG/0.4ML injection Inject 0.4 mLs (40 mg total) into the skin daily for 10 days. 10 day supply 1 injection per day 4 mL 0   ferrous sulfate 325 (65 FE) MG tablet Take 1 tablet (325 mg total) by mouth 3 (three) times daily with meals. 60 tablet 0   neomycin-polymyxin-hydrocortisone (CORTISPORIN) 3.5-10000-1 OTIC suspension Place 4 drops into the right ear 3 (three) times daily. 10 mL 0   ondansetron (ZOFRAN) 4 MG tablet Take 1 tablet (4 mg total) by mouth every 8 (eight) hours as needed for nausea or vomiting. 20 tablet 0   No facility-administered medications prior to visit.        Objective:   Physical Exam Vitals:   01/15/21 1207  BP: 108/72  Pulse: 80  Temp: 97.9 F (36.6 C)  TempSrc: Oral  SpO2: 96%  Weight: 159 lb (72.1 kg)  Height: 5\' 3"  (1.6 m)    Gen: Pleasant, well-nourished, in no distress,  normal affect  ENT: No lesions,  mouth clear,  oropharynx clear, no postnasal drip  Neck: No JVD, no stridor  Lungs: No use of accessory muscles, no crackles or wheezing on normal respiration, no wheeze on forced expiration  Cardiovascular: RRR, heart sounds normal, no murmur or gallops, no peripheral edema  Musculoskeletal: No deformities, no cyanosis or clubbing  Neuro: alert, awake, non focal  Skin: Warm, tanned, no lesions or rash      Assessment & Plan:  DOE (dyspnea on exertion) Reassuring cardiac exam and now her pulmonary function testing is grossly normal.  She did have emphysematous change on CT chest but unclear that there is any role to start scheduled bronchodilator therapy.  She has albuterol that she can use if needed.  I think a component of her dyspnea is deconditioning and she is going to start working on her exercise and conditioning.  We will perform walking oximetry on room air to ensure no occult desaturation.  Pulmonary nodule 4 mm pulmonary nodule that will  need to be followed given her history of tobacco.  Next scan to be done in July 2023.  Plan to follow-up after   August 2023, MD, PhD 01/15/2021, 12:27 PM [facility] [phone] or if no answer before 7:00PM call [phone] For any issues after 7:00PM please call eLink [phone]

## 2021-01-15 NOTE — Addendum Note (Signed)
Addended by: Dorisann Frames R on: 01/15/2021 02:14 PM   Modules accepted: Orders

## 2021-01-23 LAB — PULMONARY FUNCTION TEST
DL/VA % pred: 137 %
DL/VA: 5.76 ml/min/mmHg/L
DLCO unc % pred: 114 %
DLCO unc: 21.87 ml/min/mmHg
FEF 25-75 Post: 2.85 L/sec
FEF 25-75 Pre: 2.54 L/sec
FEF2575-%Change-Post: 12 %
FEF2575-%Pred-Post: 140 %
FEF2575-%Pred-Pre: 125 %
FEV1-%Change-Post: 2 %
FEV1-%Pred-Post: 101 %
FEV1-%Pred-Pre: 98 %
FEV1-Post: 2.32 L
FEV1-Pre: 2.27 L
FEV1FVC-%Change-Post: 2 %
FEV1FVC-%Pred-Pre: 108 %
FEV6-%Change-Post: 0 %
FEV6-%Pred-Post: 94 %
FEV6-%Pred-Pre: 94 %
FEV6-Post: 2.72 L
FEV6-Pre: 2.73 L
FEV6FVC-%Pred-Post: 104 %
FEV6FVC-%Pred-Pre: 104 %
FVC-%Change-Post: 0 %
FVC-%Pred-Post: 90 %
FVC-%Pred-Pre: 90 %
FVC-Post: 2.72 L
Post FEV1/FVC ratio: 85 %
Post FEV6/FVC ratio: 100 %
Pre FEV1/FVC ratio: 83 %
Pre FEV6/FVC Ratio: 100 %
RV % pred: 69 %
RV: 1.42 L
TLC % pred: 99 %
TLC: 4.89 L

## 2021-02-06 ENCOUNTER — Other Ambulatory Visit: Payer: Self-pay

## 2021-02-06 ENCOUNTER — Encounter: Payer: Self-pay | Admitting: Internal Medicine

## 2021-02-06 ENCOUNTER — Ambulatory Visit (INDEPENDENT_AMBULATORY_CARE_PROVIDER_SITE_OTHER): Payer: Medicare Other | Admitting: Internal Medicine

## 2021-02-06 VITALS — BP 123/79 | HR 65 | Ht 62.0 in | Wt 159.2 lb

## 2021-02-06 DIAGNOSIS — M797 Fibromyalgia: Secondary | ICD-10-CM

## 2021-02-06 DIAGNOSIS — M159 Polyosteoarthritis, unspecified: Secondary | ICD-10-CM | POA: Diagnosis not present

## 2021-02-06 NOTE — Progress Notes (Signed)
Office Visit Note  Patient: Terri Alvarado             Date of Birth: Apr 22, 1956           MRN: [id number]             PCP: Donetta Potts, MD Referring: Donetta Potts, MD Visit Date: 02/06/2021  Subjective:  Other (Patient reports joint and muscle pain all over. Patient denies swelling. )   History of Present Illness: Terri Alvarado is a 65 y.o. female with a history of HTN, GERD with barrett's esophagus, degenerative scoliosis, spinal stenosis, and neurogenic claudication and fibromyalgia syndrome here for evaluation of multiple joint pains. She has had prior orthopedic surgery for chronic cervical spine disease. She takes Cymbalta 90 mg daily for fibromyalgia and depression symptoms.  At this time her worst issues are chronic pain in the hips and knees and also in her bilateral thumbs.  Other joints are painful and sometimes has pain all over.  She remains fairly active mostly gets the pain provoked after many hours operating a lawnmower tractor or using her hands more heavily.  Pain is worse by the end of the day.  She does not notice visible swelling erythema or warmth to the touch.  She cannot take NSAIDs safely due to previous gastric ulcer with chronic use.  She did extensive physical therapy last year and has done a lot for the back with just mild success.  Besides joint pain she does not describe much headache, chronic fatigue, irritable bowel, or brain fog difficulties.  09/13/19 Xray lumbar spine IMPRESSION: Status post surgical posterior fusion of L3-4, L4-5 and L5-S1.   Activities of Daily Living:  Patient reports morning stiffness for all day. Patient Reports nocturnal pain.  Difficulty dressing/grooming: Denies Difficulty climbing stairs: Reports Difficulty getting out of chair: Reports Difficulty using hands for taps, buttons, cutlery, and/or writing: Reports  Review of Systems  Constitutional:  Negative for fatigue.  HENT:  Negative for mouth sores, mouth  dryness and nose dryness.   Eyes:  Negative for pain, itching and dryness.  Respiratory:  Positive for shortness of breath. Negative for difficulty breathing.   Cardiovascular:  Negative for chest pain and palpitations.  Gastrointestinal:  Positive for diarrhea. Negative for blood in stool and constipation.  Endocrine: Negative for increased urination.  Genitourinary:  Negative for difficulty urinating.  Musculoskeletal:  Positive for joint pain, joint pain, myalgias, morning stiffness, muscle tenderness and myalgias. Negative for joint swelling.  Skin:  Negative for color change, rash and redness.  Allergic/Immunologic: Negative for susceptible to infections.  Neurological:  Negative for dizziness, numbness, headaches, memory loss and weakness.  Hematological:  Positive for bruising/bleeding tendency.  Psychiatric/Behavioral:  Negative for confusion.    PMFS History:  Patient Active Problem List   Diagnosis Date Noted   DOE (dyspnea on exertion) 12/15/2020   Pulmonary nodule 12/15/2020   Fibromyalgia 01/25/2020   Fusion of lumbar spine 09/12/2019   History of reverse total replacement of right shoulder joint 07/19/2019   Degenerative scoliosis 06/12/2019   Rotator cuff arthropathy of right shoulder 04/26/2019   Pain of left hip joint 04/12/2019   Pain in joint of right shoulder 01/19/2019   Spinal stenosis 07/06/2018   Lumbar spondylosis 06/08/2018   Neurogenic claudication (HCC) 06/08/2018   Depression with anxiety 11/09/2017   Vitamin D deficiency 11/09/2017   Lumbar radiculopathy 09/28/2016   Degeneration of lumbar intervertebral disc 09/06/2016   Barrett's esophagus without dysplasia 11/03/2015  Anemia 08/14/2015   Complete tear of right rotator cuff 07/24/2015   Encounter for general adult medical examination without abnormal findings 03/24/2015   Essential hypertension with goal blood pressure less than 140/90 03/01/2014   Primary osteoarthritis involving multiple joints  02/01/2014   Gastroesophageal reflux disease without esophagitis 12/21/2013    Past Medical History:  Diagnosis Date   Anxiety    Degeneration of lumbar intervertebral disc    Degenerative scoliosis    Depression    GERD (gastroesophageal reflux disease)    Hypertension    Low back pain    Lumbar radiculopathy    Lumbar spondylosis    Neurogenic claudication (HCC)    Pain in joint of right shoulder    Pain of left hip joint    Rotator cuff arthropathy of right shoulder    Spinal stenosis     Family History  Problem Relation Age of Onset   Cancer Father    Bladder Cancer Brother    Healthy Son    Healthy Daughter    Past Surgical History:  Procedure Laterality Date   ABDOMINAL EXPOSURE N/A 09/12/2019   Procedure: ABDOMINAL EXPOSURE;  Surgeon: Nada Libman, MD;  Location: [facility] OR;  Service: Vascular;  Laterality: N/A;   ANTERIOR LUMBAR FUSION N/A 09/12/2019   Procedure: ANTERIOR LUMBAR FUSION LUMBAR FIVE-SACRAL ONE  ANTERIOR LATERAL LUMBAR FUSION LUMBAR THREE-LUMBAR FIVE;  Surgeon: Venita Lick, MD;  Location: [facility] OR;  Service: Orthopedics;  Laterality: N/A;  5 hrs Left sided tap block with exparel Dr. Myra Gianotti to do approach   BACK SURGERY  02/2019   CARPAL TUNNEL RELEASE  2017   left hand   POSTERIOR LUMBAR FUSION 4 LEVEL N/A 09/13/2019   Procedure: POSTERIOR LUMBAR FUSION INTERBODY LUMBAR THREE - SACRAL ONE, LUMBAR THREE KYPHOPLASTY;  Surgeon: Venita Lick, MD;  Location: [facility] OR;  Service: Orthopedics;  Laterality: N/A;  4 hrs   REVERSE SHOULDER ARTHROPLASTY Right 05/24/2019   Procedure: REVERSE SHOULDER ARTHROPLASTY SDDC;  Surgeon: Francena Hanly, MD;  Location: [facility];  Service: Orthopedics;  Laterality: Right;    SHOULDER SURGERY     left shoulder   TUBAL LIGATION     Social History   Social History Narrative   Not on file   Immunization History  Administered Date(s) Administered   Influenza Split 02/17/2011   Influenza,inj,quad, With Preservative  03/03/2013   Influenza-Unspecified 05/03/2013   PFIZER(Purple Top)SARS-COV-2 Vaccination 07/01/2019, 07/31/2019, 05/07/2020   Pneumococcal Polysaccharide-23 02/01/2020   Tdap 02/10/2012     Objective: Vital Signs: BP 123/79 (BP Location: Right Arm, Patient Position: Sitting, Cuff Size: Normal)   Pulse 65   Ht 5\' 2"  (1.575 m)   Wt 159 lb 3.2 oz (72.2 kg)   BMI 29.12 kg/m    Physical Exam HENT:     Right Ear: External ear normal.     Left Ear: External ear normal.     Mouth/Throat:     Mouth: Mucous membranes are moist.     Pharynx: Oropharynx is clear.  Eyes:     Conjunctiva/sclera: Conjunctivae normal.  Cardiovascular:     Rate and Rhythm: Normal rate and regular rhythm.  Pulmonary:     Effort: Pulmonary effort is normal.     Breath sounds: Normal breath sounds.  Musculoskeletal:     Right lower leg: No edema.     Left lower leg: No edema.  Skin:    General: Skin is warm and dry.     Findings: No rash.  Neurological:     Mental Status: She is alert.     Deep Tendon Reflexes: Reflexes normal.  Psychiatric:        Mood and Affect: Mood normal.     Musculoskeletal Exam:  Neck full ROM no tenderness Shoulders full ROM no tenderness or swelling Elbows full ROM no tenderness or swelling Wrists full ROM no tenderness or swelling Significant squaring of first CMC joints bilaterally with hyperextension of first MCP joints, Heberden's nodes present and interphalangeal joints of other digits with good range of motion no synovitis Normal hip range of motion bilaterally with no lateral tenderness to palpation Bilateral patellofemoral crepitus present no focal tenderness to palpation or palpable effusions bilateral knees     Investigation: No additional findings.  Imaging: No results found.  Recent Labs: Lab Results  Component Value Date   WBC 8.2 09/14/2019   HGB 8.5 (L) 09/14/2019   PLT 155 09/14/2019   NA 135 09/13/2019   K 3.9 09/13/2019   CL 101 09/13/2019    CO2 21 (L) 09/10/2019   GLUCOSE 138 (H) 09/13/2019   BUN 15 09/13/2019   CREATININE 0.81 09/13/2019   CALCIUM 9.3 09/10/2019   GFRAA >60 09/13/2019    Speciality Comments: No specialty comments available.  Procedures:  No procedures performed Allergies: Patient has no known allergies.   Assessment / Plan:     Visit Diagnoses: Primary osteoarthritis involving multiple joints  Her problems seem currently more due to the generalized osteoarthritis than fibromyalgia syndrome.  She is not interested in further surgeries or aggressive management at that this time unless her functional status changed significantly.  Probably benefiting from the Cymbalta so should continue this.  Discussed multiple other treatment modalities including topical anti-inflammatories, supportive devices, diet and nutritional supplements, exercise for range of motion.  Fibromyalgia syndrome  Not many tender points on exam today not many symptom complaints outside of arthralgias.  Seems to be well controlled she is already very active and on long-term SNRI treatment. Recommended review of [employer] [facility] patient fibromyalgia guide as well.  Orders: No orders of the defined types were placed in this encounter.  No orders of the defined types were placed in this encounter.    Follow-Up Instructions: No follow-ups on file.   Fuller Plan, MD  Note - This record has been created using [employer].  Chart creation errors have been sought, but may not always  have been located. Such creation errors do not reflect on  the standard of medical care.

## 2021-02-06 NOTE — Patient Instructions (Signed)
For osteoarthritis several treatments we discussed may be beneficial:  - Regular exercise produces endorphins which are a natural pain suppressant, try to find low impact exercises such as weights or water exercise or tai chi, if walking or jogging is painful.  - Some individuals benefit significantly with oral supplements containing glucosamine chondroitin or turmeric, but not all. Turmeric should not be taken in excess of recommended amounts usually 500 mg or less daily or can share some risks as with the NSAID medications.  - Topical antiinflammatory medicine such as diclofenac or Voltaren can be applied to  affected area as needed. Topical analgesics containing CBD, menthol, or lidocaine can be tried. Capsaicin containing treatments are recommended against for the hand.  - Compressive gloves can be helpful to support the thumb joint especially if hurting with certain activities.   I recommend checking out the [facility] patient-centered guide for fibromyalgia and chronic pain management: [email or web address]

## 2021-03-12 ENCOUNTER — Encounter (INDEPENDENT_AMBULATORY_CARE_PROVIDER_SITE_OTHER): Payer: Self-pay | Admitting: Gastroenterology

## 2021-03-12 ENCOUNTER — Other Ambulatory Visit (INDEPENDENT_AMBULATORY_CARE_PROVIDER_SITE_OTHER): Payer: Self-pay

## 2021-03-12 ENCOUNTER — Other Ambulatory Visit: Payer: Self-pay

## 2021-03-12 ENCOUNTER — Ambulatory Visit (INDEPENDENT_AMBULATORY_CARE_PROVIDER_SITE_OTHER): Payer: Medicare Other | Admitting: Gastroenterology

## 2021-03-12 ENCOUNTER — Encounter (INDEPENDENT_AMBULATORY_CARE_PROVIDER_SITE_OTHER): Payer: Self-pay

## 2021-03-12 ENCOUNTER — Telehealth (INDEPENDENT_AMBULATORY_CARE_PROVIDER_SITE_OTHER): Payer: Self-pay

## 2021-03-12 VITALS — BP 127/73 | HR 78 | Temp 97.8°F | Ht 62.0 in | Wt 161.7 lb

## 2021-03-12 DIAGNOSIS — D509 Iron deficiency anemia, unspecified: Secondary | ICD-10-CM

## 2021-03-12 DIAGNOSIS — K529 Noninfective gastroenteritis and colitis, unspecified: Secondary | ICD-10-CM | POA: Insufficient documentation

## 2021-03-12 DIAGNOSIS — K219 Gastro-esophageal reflux disease without esophagitis: Secondary | ICD-10-CM | POA: Diagnosis not present

## 2021-03-12 DIAGNOSIS — I10 Essential (primary) hypertension: Secondary | ICD-10-CM

## 2021-03-12 MED ORDER — PEG 3350-KCL-NA BICARB-NACL 420 G PO SOLR: 4000.0000 mL | ORAL | 0 refills | Status: DC

## 2021-03-12 NOTE — H&P (View-Only) (Signed)
Terri Alvarado, M.D. Gastroenterology & Hepatology [facility] [address] [place], [place] [address] Primary Care Physician: [place], [place] [address]  Referring MD: PCP  Chief Complaint: Iron deficiency anemia, GERD and chronic diarrhea  History of Present Illness: Terri Alvarado is a 65 y.o. female with past medical history of GERD, IBS-D, anxiety, iron deficiency anemia, depression, hypertension, spinal stenosis, who presents for follow up of Iron deficiency anemia, GERD and chronic diarrhea.  Patient reports that she was found to have anemia in her routine blood workup during mid 2022. Upon review of the medical records from [facility], the patient was found to have a hemoglobin of 8.3 with MCV of 83 on 11/18/2020.  She was started on oral iron with repeat blood testing on 12/02/2020 showing a hemoglobin of 10.7 with MCV of 86.  Rest of cell lines were within normal limits.  Iron stores on 11/18/2020 showed a normal iron of 80, TIBC of 346, iron saturation of 23% and ferritin of 12.4 (decreased).  Per previous notes from hematology consultation on 12/03/2020, the patient had evidence of iron deficiency anemia in the past (only report available shows that her ferritin was low but no other blood testing available).  Based on her notes, she had received IV iron in the past but the patient denies this and states she was started only on oral iron 3 times a day. She was taking it three times a day but now is taking it two times a day. She currently denies shortness of breath, chest pain or fatigue. She denies seeing any melena or hematochezia.  She does not take any NSAIDs, anticoagulants or antiplatelet medication.  As part of the evaluation of her anemia she underwent an EGD and colonoscopy with findings described below, this was performed by general surgery Dr. Adelina Mings.  Patient also reports that she has  had diarrhea for multiple years (40 years), usually after having something to eat. She states she has up to 7 Bms per day. Takes Imodium when she goes out , which works for the day but does not take it standing. Denies any fecal soiling or accidents when sleeping.   For the last 7 years she has presented heartburn. Since then, she was started on omeprazole 40 mg every day. Given the fact she has presented anemia, she was switched to pantoprazole 40 mg BID which she took for 2 months, but now is on 40 mg daily. She takes the medicine at 5:30 AM and does not take breakfast. She denies havign any heartburn, dysphagia or odynophagia as long as she takes her medication compliantly.  The patient denies having any nausea, vomiting, fever, chills, hematemesis, abdominal distention, abdominal pain, diarrhea, jaundice, pruritus or weight loss.  Per medical record - Gastric Urease for H. pylori 11/24/2020 Negative at 18-24 hours Negative on arrival, Negative at 2 hours, Negative at 18-24 hours Final  Appointment on 11/18/2020   Last EGD: by Dr. Rocco Serene 11/24/2020:  EGD ABNORMAL:  Findings: Diffuse moderate inflammation characterized by erythema was found in the  Gastric antrum. Biopsies were taken with a cold forceps for Helicobacter  Pylori testing using CLOtest. The esophagus was normal. The examined duodenum was normal. The pylorus appeared to be incompetent.  Impression: - Gastritis. Biopsied. - Normal esophagus. - Normal examined duodenum. Review of systems: See History for other symptoms   Last Colonoscopy: by Dr. Rocco Serene 11/24/2020:  colonoscopy by Dr. Rocco Serene  was normal and only 10 year follow up is required.  FHx: neg for any gastrointestinal/liver disease, brother bladder cancer Social: neg smoking, alcohol or illicit drug use Surgical: no abdominal surgeries  Past Medical History: Past Medical History:  Diagnosis Date   Anxiety    Degeneration of lumbar intervertebral disc     Degenerative scoliosis    Depression    GERD (gastroesophageal reflux disease)    Hypertension    Low back pain    Lumbar radiculopathy    Lumbar spondylosis    Neurogenic claudication (HCC)    Pain in joint of right shoulder    Pain of left hip joint    Rotator cuff arthropathy of right shoulder    Spinal stenosis     Past Surgical History: Past Surgical History:  Procedure Laterality Date   ABDOMINAL EXPOSURE N/A 09/12/2019   Procedure: ABDOMINAL EXPOSURE;  Surgeon: Nada Libman, MD;  Location: [facility];  Service: Vascular;  Laterality: N/A;   ANTERIOR LUMBAR FUSION N/A 09/12/2019   Procedure: ANTERIOR LUMBAR FUSION LUMBAR FIVE-SACRAL ONE  ANTERIOR LATERAL LUMBAR FUSION LUMBAR THREE-LUMBAR FIVE;  Surgeon: Venita Lick, MD;  Location: [facility] OR;  Service: Orthopedics;  Laterality: N/A;  5 hrs Left sided tap block with exparel Dr. Myra Gianotti to do approach   BACK SURGERY  02/2019   CARPAL TUNNEL RELEASE  2017   left hand   POSTERIOR LUMBAR FUSION 4 LEVEL N/A 09/13/2019   Procedure: POSTERIOR LUMBAR FUSION INTERBODY LUMBAR THREE - SACRAL ONE, LUMBAR THREE KYPHOPLASTY;  Surgeon: Venita Lick, MD;  Location: [facility] OR;  Service: Orthopedics;  Laterality: N/A;  4 hrs   REVERSE SHOULDER ARTHROPLASTY Right 05/24/2019   Procedure: REVERSE SHOULDER ARTHROPLASTY SDDC;  Surgeon: Francena Hanly, MD;  Location: [facility];  Service: Orthopedics;  Laterality: Right;    SHOULDER SURGERY     left shoulder   TUBAL LIGATION      Family History: Family History  Problem Relation Age of Onset   Cancer Father    Bladder Cancer Brother    Healthy Son    Healthy Daughter     Social History: Social History   Tobacco Use  Smoking Status Former   Packs/day: 1.00   Years: 35.00   Pack years: 35.00   Types: Cigarettes   Quit date: 06/12/2013   Years since quitting: 7.7  Smokeless Tobacco Never   Social History   Substance and Sexual Activity  Alcohol Use Yes   Comment: occassionally    Social History   Substance and Sexual Activity  Drug Use Never    Allergies: No Known Allergies  Medications: Current Outpatient Medications  Medication Sig Dispense Refill   DULoxetine (CYMBALTA) 60 MG capsule Take 90 mg by mouth daily.     ferrous sulfate 325 (65 FE) MG EC tablet Take by mouth 3 (three) times daily with meals.     folic acid (FOLVITE) 1 MG tablet Take by mouth.     lisinopril-hydrochlorothiazide (ZESTORETIC) 20-12.5 MG tablet Take 1 tablet by mouth daily.      pantoprazole (PROTONIX) 40 MG tablet Take by mouth.     Sulfamethoxazole-Trimethoprim (BACTRIM PO) Take by mouth daily.     No current facility-administered medications for this visit.    Review of Systems: GENERAL: negative for malaise, night sweats HEENT: No changes in hearing or vision, no nose bleeds or other nasal problems. NECK: Negative for lumps, goiter, pain and significant neck swelling RESPIRATORY: Negative for cough, wheezing CARDIOVASCULAR: Negative for chest  pain, leg swelling, palpitations, orthopnea GI: SEE HPI MUSCULOSKELETAL: Negative for joint pain or swelling, back pain, and muscle pain. SKIN: Negative for lesions, rash PSYCH: Negative for sleep disturbance, mood disorder and recent psychosocial stressors. HEMATOLOGY Negative for prolonged bleeding, bruising easily, and swollen nodes. ENDOCRINE: Negative for cold or heat intolerance, polyuria, polydipsia and goiter. NEURO: negative for tremor, gait imbalance, syncope and seizures. The remainder of the review of systems is noncontributory.   Physical Exam: BP 127/73 (BP Location: Left Arm, Patient Position: Sitting, Cuff Size: Large)   Pulse 78   Temp 97.8 F (36.6 C) (Oral)   Ht 5\' 2"  (1.575 m)   Wt 161 lb 11.2 oz (73.3 kg)   BMI 29.58 kg/m  GENERAL: The patient is AO x3, in no acute distress. HEENT: Head is normocephalic and atraumatic. EOMI are intact. Mouth is well hydrated and without lesions. NECK: Supple. No  masses LUNGS: Clear to auscultation. No presence of rhonchi/wheezing/rales. Adequate chest expansion HEART: RRR, normal s1 and s2. ABDOMEN: Soft, nontender, no guarding, no peritoneal signs, and nondistended. BS +. No masses. EXTREMITIES: Without any cyanosis, clubbing, rash, lesions or edema. NEUROLOGIC: AOx3, no focal motor deficit. SKIN: no jaundice, no rashes   Imaging/Labs: as above  I personally reviewed and interpreted the available labs, imaging and endoscopic files.  Impression and Plan: Terri Alvarado is a 65 y.o. female with past medical history of GERD, IBS-D, anxiety, iron deficiency anemia, depression, hypertension, spinal stenosis, who presents for follow up of Iron deficiency anemia, GERD and chronic diarrhea.  Regarding her iron deficiency anemia, she had presence of low ferritin in the blood work-up available at care everywhere, she had this performed at [facility].  She responded to oral iron as her hemoglobin has improved.  She has not presented any overt episodes of gastrointestinal bleeding.  I discussed with patient the need to repeat an EGD and a colonoscopy to have a thorough evaluation prior to performing a capsule endoscopy.  She understood and agreed.  We will check her iron stores today.  She has chronic history of diarrhea which I think is related to functional disorder given the chronicity of her symptoms.  She has been able to manage this with Imodium and is not interested in any change in medication but will obtain random colonic biopsies to rule out microscopic colitis.  Finally, she has a history of GERD which has been managed adequately with PPI.  She is not having any symptoms on the current dose of pantoprazole 40 mg every day which will be continued.  It will be discussed in the future if we can decrease the dose to 20 g dosing.  - Continue Imodium as needed for diarrhea - Schedule EGD and colonoscopy with random colonic biopsies - Continue  pantoprazole 40 mg qday - Check CBC and iron stores - Continue oral iron twice a day, follow up with hematology  All questions were answered.      Terri Peppers, MD Gastroenterology and Hepatology [facility]

## 2021-03-12 NOTE — Progress Notes (Signed)
Maylon Peppers, M.D. Gastroenterology & Hepatology [facility] [address] [place], [place] [address] Primary Care Physician: [place], [place] [id number]  Referring MD: PCP  Chief Complaint: Iron deficiency anemia, GERD and chronic diarrhea  History of Present Illness: Terri Alvarado is a 65 y.o. female with past medical history of GERD, IBS-D, anxiety, iron deficiency anemia, depression, hypertension, spinal stenosis, who presents for follow up of Iron deficiency anemia, GERD and chronic diarrhea.  Patient reports that she was found to have anemia in her routine blood workup during mid 2022. Upon review of the medical records from [facility], the patient was found to have a hemoglobin of 8.3 with MCV of 83 on 11/18/2020.  She was started on oral iron with repeat blood testing on 12/02/2020 showing a hemoglobin of 10.7 with MCV of 86.  Rest of cell lines were within normal limits.  Iron stores on 11/18/2020 showed a normal iron of 80, TIBC of 346, iron saturation of 23% and ferritin of 12.4 (decreased).  Per previous notes from hematology consultation on 12/03/2020, the patient had evidence of iron deficiency anemia in the past (only report available shows that her ferritin was low but no other blood testing available).  Based on her notes, she had received IV iron in the past but the patient denies this and states she was started only on oral iron 3 times a day. She was taking it three times a day but now is taking it two times a day. She currently denies shortness of breath, chest pain or fatigue. She denies seeing any melena or hematochezia.  She does not take any NSAIDs, anticoagulants or antiplatelet medication.  As part of the evaluation of her anemia she underwent an EGD and colonoscopy with findings described below, this was performed by general surgery Dr. Adelina Mings.  Patient also reports that she has  had diarrhea for multiple years (40 years), usually after having something to eat. She states she has up to 7 Bms per day. Takes Imodium when she goes out , which works for the day but does not take it standing. Denies any fecal soiling or accidents when sleeping.   For the last 7 years she has presented heartburn. Since then, she was started on omeprazole 40 mg every day. Given the fact she has presented anemia, she was switched to pantoprazole 40 mg BID which she took for 2 months, but now is on 40 mg daily. She takes the medicine at 5:30 AM and does not take breakfast. She denies havign any heartburn, dysphagia or odynophagia as long as she takes her medication compliantly.  The patient denies having any nausea, vomiting, fever, chills, hematemesis, abdominal distention, abdominal pain, diarrhea, jaundice, pruritus or weight loss.  Per medical record - Gastric Urease for H. pylori 11/24/2020 Negative at 18-24 hours Negative on arrival, Negative at 2 hours, Negative at 18-24 hours Final  Appointment on 11/18/2020   Last EGD: by Dr. Rocco Serene 11/24/2020:  EGD ABNORMAL:  Findings: Diffuse moderate inflammation characterized by erythema was found in the  Gastric antrum. Biopsies were taken with a cold forceps for Helicobacter  Pylori testing using CLOtest. The esophagus was normal. The examined duodenum was normal. The pylorus appeared to be incompetent.  Impression: - Gastritis. Biopsied. - Normal esophagus. - Normal examined duodenum. Review of systems: See History for other symptoms   Last Colonoscopy: by Dr. Rocco Serene 11/24/2020:  colonoscopy by Dr. Rocco Serene  was normal and only 10 year follow up is required.  FHx: neg for any gastrointestinal/liver disease, brother bladder cancer Social: neg smoking, alcohol or illicit drug use Surgical: no abdominal surgeries  Past Medical History: Past Medical History:  Diagnosis Date   Anxiety    Degeneration of lumbar intervertebral disc     Degenerative scoliosis    Depression    GERD (gastroesophageal reflux disease)    Hypertension    Low back pain    Lumbar radiculopathy    Lumbar spondylosis    Neurogenic claudication (HCC)    Pain in joint of right shoulder    Pain of left hip joint    Rotator cuff arthropathy of right shoulder    Spinal stenosis     Past Surgical History: Past Surgical History:  Procedure Laterality Date   ABDOMINAL EXPOSURE N/A 09/12/2019   Procedure: ABDOMINAL EXPOSURE;  Surgeon: Nada Libman, MD;  Location: [facility] OR;  Service: Vascular;  Laterality: N/A;   ANTERIOR LUMBAR FUSION N/A 09/12/2019   Procedure: ANTERIOR LUMBAR FUSION LUMBAR FIVE-SACRAL ONE  ANTERIOR LATERAL LUMBAR FUSION LUMBAR THREE-LUMBAR FIVE;  Surgeon: Venita Lick, MD;  Location: [facility] OR;  Service: Orthopedics;  Laterality: N/A;  5 hrs Left sided tap block with exparel Dr. Myra Gianotti to do approach   BACK SURGERY  02/2019   CARPAL TUNNEL RELEASE  2017   left hand   POSTERIOR LUMBAR FUSION 4 LEVEL N/A 09/13/2019   Procedure: POSTERIOR LUMBAR FUSION INTERBODY LUMBAR THREE - SACRAL ONE, LUMBAR THREE KYPHOPLASTY;  Surgeon: Venita Lick, MD;  Location: [facility] OR;  Service: Orthopedics;  Laterality: N/A;  4 hrs   REVERSE SHOULDER ARTHROPLASTY Right 05/24/2019   Procedure: REVERSE SHOULDER ARTHROPLASTY SDDC;  Surgeon: Francena Hanly, MD;  Location: [facility];  Service: Orthopedics;  Laterality: Right;    SHOULDER SURGERY     left shoulder   TUBAL LIGATION      Family History: Family History  Problem Relation Age of Onset   Cancer Father    Bladder Cancer Brother    Healthy Son    Healthy Daughter     Social History: Social History   Tobacco Use  Smoking Status Former   Packs/day: 1.00   Years: 35.00   Pack years: 35.00   Types: Cigarettes   Quit date: 06/12/2013   Years since quitting: 7.7  Smokeless Tobacco Never   Social History   Substance and Sexual Activity  Alcohol Use Yes   Comment: occassionally    Social History   Substance and Sexual Activity  Drug Use Never    Allergies: No Known Allergies  Medications: Current Outpatient Medications  Medication Sig Dispense Refill   DULoxetine (CYMBALTA) 60 MG capsule Take 90 mg by mouth daily.     ferrous sulfate 325 (65 FE) MG EC tablet Take by mouth 3 (three) times daily with meals.     folic acid (FOLVITE) 1 MG tablet Take by mouth.     lisinopril-hydrochlorothiazide (ZESTORETIC) 20-12.5 MG tablet Take 1 tablet by mouth daily.      pantoprazole (PROTONIX) 40 MG tablet Take by mouth.     Sulfamethoxazole-Trimethoprim (BACTRIM PO) Take by mouth daily.     No current facility-administered medications for this visit.    Review of Systems: GENERAL: negative for malaise, night sweats HEENT: No changes in hearing or vision, no nose bleeds or other nasal problems. NECK: Negative for lumps, goiter, pain and significant neck swelling RESPIRATORY: Negative for cough, wheezing CARDIOVASCULAR: Negative for chest  pain, leg swelling, palpitations, orthopnea GI: SEE HPI MUSCULOSKELETAL: Negative for joint pain or swelling, back pain, and muscle pain. SKIN: Negative for lesions, rash PSYCH: Negative for sleep disturbance, mood disorder and recent psychosocial stressors. HEMATOLOGY Negative for prolonged bleeding, bruising easily, and swollen nodes. ENDOCRINE: Negative for cold or heat intolerance, polyuria, polydipsia and goiter. NEURO: negative for tremor, gait imbalance, syncope and seizures. The remainder of the review of systems is noncontributory.   Physical Exam: BP 127/73 (BP Location: Left Arm, Patient Position: Sitting, Cuff Size: Large)   Pulse 78   Temp 97.8 F (36.6 C) (Oral)   Ht 5\' 2"  (1.575 m)   Wt 161 lb 11.2 oz (73.3 kg)   BMI 29.58 kg/m  GENERAL: The patient is AO x3, in no acute distress. HEENT: Head is normocephalic and atraumatic. EOMI are intact. Mouth is well hydrated and without lesions. NECK: Supple. No  masses LUNGS: Clear to auscultation. No presence of rhonchi/wheezing/rales. Adequate chest expansion HEART: RRR, normal s1 and s2. ABDOMEN: Soft, nontender, no guarding, no peritoneal signs, and nondistended. BS +. No masses. EXTREMITIES: Without any cyanosis, clubbing, rash, lesions or edema. NEUROLOGIC: AOx3, no focal motor deficit. SKIN: no jaundice, no rashes   Imaging/Labs: as above  I personally reviewed and interpreted the available labs, imaging and endoscopic files.  Impression and Plan: Terri Alvarado is a 65 y.o. female with past medical history of GERD, IBS-D, anxiety, iron deficiency anemia, depression, hypertension, spinal stenosis, who presents for follow up of Iron deficiency anemia, GERD and chronic diarrhea.  Regarding her iron deficiency anemia, she had presence of low ferritin in the blood work-up available at care everywhere, she had this performed at [facility].  She responded to oral iron as her hemoglobin has improved.  She has not presented any overt episodes of gastrointestinal bleeding.  I discussed with patient the need to repeat an EGD and a colonoscopy to have a thorough evaluation prior to performing a capsule endoscopy.  She understood and agreed.  We will check her iron stores today.  She has chronic history of diarrhea which I think is related to functional disorder given the chronicity of her symptoms.  She has been able to manage this with Imodium and is not interested in any change in medication but will obtain random colonic biopsies to rule out microscopic colitis.  Finally, she has a history of GERD which has been managed adequately with PPI.  She is not having any symptoms on the current dose of pantoprazole 40 mg every day which will be continued.  It will be discussed in the future if we can decrease the dose to 20 g dosing.  - Continue Imodium as needed for diarrhea - Schedule EGD and colonoscopy with random colonic biopsies - Continue  pantoprazole 40 mg qday - Check CBC and iron stores - Continue oral iron twice a day, follow up with hematology  All questions were answered.      Maylon Peppers, MD Gastroenterology and Hepatology [facility]

## 2021-03-12 NOTE — Telephone Encounter (Signed)
LeighAnn Zubin Pontillo, CMA  

## 2021-03-12 NOTE — Patient Instructions (Addendum)
Continue Imodium as needed for diarrhea Schedule EGD and colonoscopy Perform blood workup Continue oral iron twice a day, follow up with hematology

## 2021-03-13 LAB — CBC WITH DIFFERENTIAL/PLATELET
Absolute Monocytes: 450 cells/uL (ref 200–950)
Basophils Absolute: 30 cells/uL (ref 0–200)
Basophils Relative: 0.6 %
Eosinophils Absolute: 100 cells/uL (ref 15–500)
Eosinophils Relative: 2 %
HCT: 38 % (ref 35.0–45.0)
Hemoglobin: 12.6 g/dL (ref 11.7–15.5)
Lymphs Abs: 930 cells/uL (ref 850–3900)
MCH: 29.9 pg (ref 27.0–33.0)
MCHC: 33.2 g/dL (ref 32.0–36.0)
MCV: 90 fL (ref 80.0–100.0)
MPV: 10.6 fL (ref 7.5–12.5)
Monocytes Relative: 9 %
Neutro Abs: 3490 cells/uL (ref 1500–7800)
Neutrophils Relative %: 69.8 %
Platelets: 300 10*3/uL (ref 140–400)
RBC: 4.22 10*6/uL (ref 3.80–5.10)
RDW: 13.1 % (ref 11.0–15.0)
Total Lymphocyte: 18.6 %
WBC: 5 10*3/uL (ref 3.8–10.8)

## 2021-03-13 LAB — IRON, TOTAL/TOTAL IRON BINDING CAP
%SAT: 64 % (calc) — ABNORMAL HIGH (ref 16–45)
Iron: 216 ug/dL — ABNORMAL HIGH (ref 45–160)
TIBC: 338 mcg/dL (calc) (ref 250–450)

## 2021-03-13 LAB — FERRITIN: Ferritin: 30 ng/mL (ref 16–288)

## 2021-03-20 ENCOUNTER — Encounter (INDEPENDENT_AMBULATORY_CARE_PROVIDER_SITE_OTHER): Payer: Self-pay

## 2021-04-02 NOTE — Patient Instructions (Signed)
Terri Alvarado  04/02/2021     @PREFPERIOPPHARMACY @   Your procedure is scheduled on  04/08/2021.   Report to 14/10/2020 at  0945  A.M.   Call this number if you have problems the morning of surgery:  [phone]   Remember:  Follow the diet and prep instructions given to you by the office.    Take these medicines the morning of surgery with A SIP OF WATER                        cymbalta, protonix.     Do not wear jewelry, make-up or nail polish.  Do not wear lotions, powders, or perfumes, or deodorant.  Do not shave 48 hours prior to surgery.  Men may shave face and neck.  Do not bring valuables to the hospital.  [facility] is not responsible for any belongings or valuables.  Contacts, dentures or bridgework may not be worn into surgery.  Leave your suitcase in the car.  After surgery it may be brought to your room.  For patients admitted to the hospital, discharge time will be determined by your treatment team.  Patients discharged the day of surgery will not be allowed to drive home and must have someone with them for 24 hours.    Special instructions:   DO NOT smoke tobacco or vape fore 24 hours before your procedure.  Please read over the following fact sheets that you were given. Anesthesia Post-op Instructions and Care and Recovery After Surgery      Upper Endoscopy, Adult, Care After This sheet gives you information about how to care for yourself after your procedure. Your health care provider may also give you more specific instructions. If you have problems or questions, contact your health care provider. What can I expect after the procedure? After the procedure, it is common to have: A sore throat. Mild stomach pain or discomfort. Bloating. Nausea. Follow these instructions at home:  Follow instructions from your health care provider about what to eat or drink after your procedure. Return to your normal activities as told by your health  care provider. Ask your health care provider what activities are safe for you. Take over-the-counter and prescription medicines only as told by your health care provider. If you were given a sedative during the procedure, it can affect you for several hours. Do not drive or operate machinery until your health care provider says that it is safe. Keep all follow-up visits as told by your health care provider. This is important. Contact a health care provider if you have: A sore throat that lasts longer than one day. Trouble swallowing. Get help right away if: You vomit blood or your vomit looks like coffee grounds. You have: A fever. Bloody, black, or tarry stools. A severe sore throat or you cannot swallow. Difficulty breathing. Severe pain in your chest or abdomen. Summary After the procedure, it is common to have a sore throat, mild stomach discomfort, bloating, and nausea. If you were given a sedative during the procedure, it can affect you for several hours. Do not drive or operate machinery until your health care provider says that it is safe. Follow instructions from your health care provider about what to eat or drink after your procedure. Return to your normal activities as told by your health care provider. This information is not intended to replace advice given to you by your  health care provider. Make sure you discuss any questions you have with your health care provider. Document Revised: 02/23/2019 Document Reviewed: 09/19/2017 Elsevier Patient Education  2022 [employer] Colonoscopy, Adult, Care After This sheet gives you information about how to care for yourself after your procedure. Your health care provider may also give you more specific instructions. If you have problems or questions, contact your health care provider. What can I expect after the procedure? After the procedure, it is common to have: A small amount of blood in your stool for 24 hours after the  procedure. Some gas. Mild cramping or bloating of your abdomen. Follow these instructions at home: Eating and drinking  Drink enough fluid to keep your urine pale yellow. Follow instructions from your health care provider about eating or drinking restrictions. Resume your normal diet as instructed by your health care provider. Avoid heavy or fried foods that are hard to digest. Activity Rest as told by your health care provider. Avoid sitting for a long time without moving. Get up to take short walks every 1-2 hours. This is important to improve blood flow and breathing. Ask for help if you feel weak or unsteady. Return to your normal activities as told by your health care provider. Ask your health care provider what activities are safe for you. Managing cramping and bloating  Try walking around when you have cramps or feel bloated. Apply heat to your abdomen as told by your health care provider. Use the heat source that your health care provider recommends, such as a moist heat pack or a heating pad. Place a towel between your skin and the heat source. Leave the heat on for 20-30 minutes. Remove the heat if your skin turns bright red. This is especially important if you are unable to feel pain, heat, or cold. You may have a greater risk of getting burned. General instructions If you were given a sedative during the procedure, it can affect you for several hours. Do not drive or operate machinery until your health care provider says that it is safe. For the first 24 hours after the procedure: Do not sign important documents. Do not drink alcohol. Do your regular daily activities at a slower pace than normal. Eat soft foods that are easy to digest. Take over-the-counter and prescription medicines only as told by your health care provider. Keep all follow-up visits as told by your health care provider. This is important. Contact a health care provider if: You have blood in your stool 2-3  days after the procedure. Get help right away if you have: More than a small spotting of blood in your stool. Large blood clots in your stool. Swelling of your abdomen. Nausea or vomiting. A fever. Increasing pain in your abdomen that is not relieved with medicine. Summary After the procedure, it is common to have a small amount of blood in your stool. You may also have mild cramping and bloating of your abdomen. If you were given a sedative during the procedure, it can affect you for several hours. Do not drive or operate machinery until your health care provider says that it is safe. Get help right away if you have a lot of blood in your stool, nausea or vomiting, a fever, or increased pain in your abdomen. This information is not intended to replace advice given to you by your health care provider. Make sure you discuss any questions you have with your health care provider. Document Revised: 02/23/2019 Document Reviewed: 11/13/2018  Elsevier Patient Education  2022 Else[employer]. Monitored Anesthesia Care, Care After This sheet gives you information about how to care for yourself after your procedure. Your health care provider may also give you more specific instructions. If you have problems or questions, contact your health care provider. What can I expect after the procedure? After the procedure, it is common to have: Tiredness. Forgetfulness about what happened after the procedure. Impaired judgment for important decisions. Nausea or vomiting. Some difficulty with balance. Follow these instructions at home: For the time period you were told by your health care provider:   Rest as needed. Do not participate in activities where you could fall or become injured. Do not drive or use machinery. Do not drink alcohol. Do not take sleeping pills or medicines that cause drowsiness. Do not make important decisions or sign legal documents. Do not take care of children on your own. Eating  and drinking Follow the diet that is recommended by your health care provider. Drink enough fluid to keep your urine pale yellow. If you vomit: Drink water, juice, or soup when you can drink without vomiting. Make sure you have little or no nausea before eating solid foods. General instructions Have a responsible adult stay with you for the time you are told. It is important to have someone help care for you until you are awake and alert. Take over-the-counter and prescription medicines only as told by your health care provider. If you have sleep apnea, surgery and certain medicines can increase your risk for breathing problems. Follow instructions from your health care provider about wearing your sleep device: Anytime you are sleeping, including during daytime naps. While taking prescription pain medicines, sleeping medicines, or medicines that make you drowsy. Avoid smoking. Keep all follow-up visits as told by your health care provider. This is important. Contact a health care provider if: You keep feeling nauseous or you keep vomiting. You feel light-headed. You are still sleepy or having trouble with balance after 24 hours. You develop a rash. You have a fever. You have redness or swelling around the IV site. Get help right away if: You have trouble breathing. You have new-onset confusion at home. Summary For several hours after your procedure, you may feel tired. You may also be forgetful and have poor judgment. Have a responsible adult stay with you for the time you are told. It is important to have someone help care for you until you are awake and alert. Rest as told. Do not drive or operate machinery. Do not drink alcohol or take sleeping pills. Get help right away if you have trouble breathing, or if you suddenly become confused. This information is not intended to replace advice given to you by your health care provider. Make sure you discuss any questions you have with your  health care provider. Document Revised: 01/03/2020 Document Reviewed: 03/22/2019 Elsevier Patient Education  2022 [employer].

## 2021-04-03 ENCOUNTER — Encounter (HOSPITAL_COMMUNITY): Payer: Self-pay

## 2021-04-03 ENCOUNTER — Other Ambulatory Visit (HOSPITAL_COMMUNITY)
Admission: RE | Admit: 2021-04-03 | Discharge: 2021-04-03 | Disposition: A | Discharge status: Home / Self Care | Payer: Medicare Other | Source: Ambulatory Visit | Attending: Gastroenterology | Admitting: Gastroenterology

## 2021-04-03 ENCOUNTER — Other Ambulatory Visit: Payer: Self-pay

## 2021-04-03 VITALS — BP 112/72 | HR 75 | Temp 98.6°F | Resp 18 | Ht 63.0 in | Wt 155.0 lb

## 2021-04-03 DIAGNOSIS — Z79899 Other long term (current) drug therapy: Secondary | ICD-10-CM

## 2021-04-03 DIAGNOSIS — I10 Essential (primary) hypertension: Secondary | ICD-10-CM | POA: Insufficient documentation

## 2021-04-03 DIAGNOSIS — Z01818 Encounter for other preprocedural examination: Secondary | ICD-10-CM | POA: Diagnosis not present

## 2021-04-03 LAB — BASIC METABOLIC PANEL
Anion gap: 7 (ref 5–15)
BUN: 21 mg/dL (ref 8–23)
CO2: 22 mmol/L (ref 22–32)
Calcium: 8 mg/dL — ABNORMAL LOW (ref 8.9–10.3)
Chloride: 104 mmol/L (ref 98–111)
Creatinine, Ser: 1.28 mg/dL — ABNORMAL HIGH (ref 0.44–1.00)
GFR, Estimated: 46 mL/min — ABNORMAL LOW (ref >60)
Glucose, Bld: 72 mg/dL (ref 70–99)
Potassium: 4.2 mmol/L (ref 3.5–5.1)
Sodium: 133 mmol/L — ABNORMAL LOW (ref 135–145)

## 2021-04-08 ENCOUNTER — Ambulatory Visit (HOSPITAL_COMMUNITY)
Admission: RE | Admit: 2021-04-08 | Discharge: 2021-04-08 | Disposition: A | Discharge status: Home / Self Care | Payer: Medicare Other | Attending: Gastroenterology | Admitting: Gastroenterology

## 2021-04-08 ENCOUNTER — Encounter (HOSPITAL_COMMUNITY): Payer: Self-pay | Admitting: Gastroenterology

## 2021-04-08 ENCOUNTER — Ambulatory Visit (HOSPITAL_COMMUNITY): Payer: Medicare Other | Admitting: Anesthesiology

## 2021-04-08 ENCOUNTER — Encounter (HOSPITAL_COMMUNITY)
Admission: RE | Disposition: A | Discharge status: Home / Self Care | Payer: Self-pay | Source: Home / Self Care | Attending: Gastroenterology

## 2021-04-08 DIAGNOSIS — K2289 Other specified disease of esophagus: Secondary | ICD-10-CM | POA: Diagnosis not present

## 2021-04-08 DIAGNOSIS — K644 Residual hemorrhoidal skin tags: Secondary | ICD-10-CM | POA: Diagnosis not present

## 2021-04-08 DIAGNOSIS — K648 Other hemorrhoids: Secondary | ICD-10-CM | POA: Diagnosis not present

## 2021-04-08 DIAGNOSIS — K31819 Angiodysplasia of stomach and duodenum without bleeding: Secondary | ICD-10-CM | POA: Insufficient documentation

## 2021-04-08 DIAGNOSIS — K5730 Diverticulosis of large intestine without perforation or abscess without bleeding: Secondary | ICD-10-CM | POA: Diagnosis not present

## 2021-04-08 DIAGNOSIS — K3189 Other diseases of stomach and duodenum: Secondary | ICD-10-CM

## 2021-04-08 DIAGNOSIS — D509 Iron deficiency anemia, unspecified: Secondary | ICD-10-CM | POA: Insufficient documentation

## 2021-04-08 DIAGNOSIS — K529 Noninfective gastroenteritis and colitis, unspecified: Secondary | ICD-10-CM | POA: Insufficient documentation

## 2021-04-08 HISTORY — PX: ESOPHAGOGASTRODUODENOSCOPY (EGD) WITH PROPOFOL: SHX5813

## 2021-04-08 HISTORY — PX: COLONOSCOPY WITH PROPOFOL: SHX5780

## 2021-04-08 HISTORY — PX: BIOPSY: SHX5522

## 2021-04-08 HISTORY — PX: HOT HEMOSTASIS: SHX5433

## 2021-04-08 LAB — HM COLONOSCOPY

## 2021-04-08 SURGERY — COLONOSCOPY WITH PROPOFOL
Anesthesia: General

## 2021-04-08 MED ORDER — GLUCAGON HCL RDNA (DIAGNOSTIC) 1 MG IJ SOLR
INTRAMUSCULAR | Status: AC
  Filled 2021-04-08: qty 1

## 2021-04-08 MED ORDER — PROPOFOL 500 MG/50ML IV EMUL
INTRAVENOUS | Status: DC | PRN
  Administered 2021-04-08: 150 ug/kg/min via INTRAVENOUS

## 2021-04-08 MED ORDER — DEXMEDETOMIDINE (PRECEDEX) IN NS 20 MCG/5ML (4 MCG/ML) IV SYRINGE
PREFILLED_SYRINGE | INTRAVENOUS | Status: DC | PRN
  Administered 2021-04-08 (×2): 10 ug via INTRAVENOUS

## 2021-04-08 MED ORDER — LIDOCAINE HCL (CARDIAC) PF 100 MG/5ML IV SOSY
PREFILLED_SYRINGE | INTRAVENOUS | Status: DC | PRN
  Administered 2021-04-08: 50 mg via INTRAVENOUS

## 2021-04-08 MED ORDER — GLUCAGON HCL RDNA (DIAGNOSTIC) 1 MG IJ SOLR
INTRAMUSCULAR | Status: DC | PRN
  Administered 2021-04-08: .25 mg via INTRAVENOUS

## 2021-04-08 MED ORDER — PROPOFOL 10 MG/ML IV BOLUS
INTRAVENOUS | Status: DC | PRN
  Administered 2021-04-08 (×2): 50 mg via INTRAVENOUS
  Administered 2021-04-08: 100 mg via INTRAVENOUS

## 2021-04-08 MED ORDER — DEXMEDETOMIDINE (PRECEDEX) IN NS 20 MCG/5ML (4 MCG/ML) IV SYRINGE
PREFILLED_SYRINGE | INTRAVENOUS | Status: AC
  Filled 2021-04-08: qty 5

## 2021-04-08 MED ORDER — LACTATED RINGERS IV SOLN: INTRAVENOUS | Status: DC

## 2021-04-08 MED ORDER — PHENYLEPHRINE 40 MCG/ML (10ML) SYRINGE FOR IV PUSH (FOR BLOOD PRESSURE SUPPORT)
PREFILLED_SYRINGE | INTRAVENOUS | Status: DC | PRN
  Administered 2021-04-08: 80 ug via INTRAVENOUS
  Administered 2021-04-08: 40 ug via INTRAVENOUS
  Administered 2021-04-08: 80 ug via INTRAVENOUS
  Administered 2021-04-08: 120 ug via INTRAVENOUS
  Administered 2021-04-08: 80 ug via INTRAVENOUS

## 2021-04-08 NOTE — Anesthesia Procedure Notes (Signed)
Date/Time: 04/08/2021 11:01 AM Performed by: Julian Reil, CRNA Pre-anesthesia Checklist: Patient identified, Emergency Drugs available, Suction available and Patient being monitored Patient Re-evaluated:Patient Re-evaluated prior to induction Oxygen Delivery Method: Nasal cannula Induction Type: IV induction Placement Confirmation: positive ETCO2

## 2021-04-08 NOTE — Anesthesia Postprocedure Evaluation (Signed)
Anesthesia Post Note  Patient: Terri Alvarado  Procedure(s) Performed: COLONOSCOPY WITH PROPOFOL ESOPHAGOGASTRODUODENOSCOPY (EGD) WITH PROPOFOL BIOPSY HOT HEMOSTASIS (ARGON PLASMA COAGULATION/BICAP)  Patient location during evaluation: Phase II Anesthesia Type: General Level of consciousness: awake and alert and oriented Pain management: pain level controlled Vital Signs Assessment: post-procedure vital signs reviewed and stable Respiratory status: spontaneous breathing, nonlabored ventilation and respiratory function stable Cardiovascular status: blood pressure returned to baseline and stable Postop Assessment: no apparent nausea or vomiting Anesthetic complications: no   No notable events documented.   Last Vitals:  Vitals:   04/08/21 1041 04/08/21 1149  BP: 137/81 101/68  Pulse: 77 66  Resp: 20 19  Temp: 36.8 C 36.4 C  SpO2: (!) 19% 97%    Last Pain:  Vitals:   04/08/21 1149  TempSrc: Oral  PainSc: 0-No pain                 Edrick Whitehorn C Mariaisabel Bodiford

## 2021-04-08 NOTE — Op Note (Signed)
[facility] Patient Name: Terri Alvarado Procedure Date: 04/08/2021 10:32 AM MRN: [id number] Date of Birth: October 28, 1955 Attending MD: Katrinka Blazing ,  CSN: [id number] Age: 65 Admit Type: Outpatient Procedure:                Upper GI endoscopy Indications:              Iron deficiency anemia Providers:                Katrinka Blazing, Edrick Kins, RN, Pandora Leiter,                            Technician Referring MD:              Medicines:                Monitored Anesthesia Care Complications:            No immediate complications. Estimated Blood Loss:     Estimated blood loss: none. Procedure:                Pre-Anesthesia Assessment:                           - Prior to the procedure, a History and Physical                            was performed, and patient medications, allergies                            and sensitivities were reviewed. The patient's                            tolerance of previous anesthesia was reviewed.                           - The risks and benefits of the procedure and the                            sedation options and risks were discussed with the                            patient. All questions were answered and informed                            consent was obtained.                           - ASA Grade Assessment: II - A patient with mild                            systemic disease.                           After obtaining informed consent, the endoscope was                            passed under direct vision. Throughout the  procedure, the patient's blood pressure, pulse, and                            oxygen saturations were monitored continuously. The                            [id number]-[id number] ([id number]) scope was introduced through the                            mouth, and advanced to the third part of duodenum.                            The upper GI endoscopy was accomplished without                             difficulty. The patient tolerated the procedure                            well. Scope In: 11:00:22 AM Scope Out: 11:22:54 AM Total Procedure Duration: 0 hours 22 minutes 32 seconds  Findings:      Diffuse mucosal variance characterized by paper crepe appearance was       found in the entire esophagus. Biopsies were obtained from the proximal       and distal esophagus with cold forceps for histology of suspected       eosinophilic esophagitis.      Localized mildly erythematous mucosa without bleeding was found in the       gastric antrum. Biopsies were taken with a cold forceps for Helicobacter       pylori testing.      A single small angioectasia without bleeding was found in the third       portion of the duodenum. Coagulation for bleeding prevention using argon       plasma at 0.3 liters/minute and 20 watts was successful.      The rest of the examined duodenum was normal. Biopsies were taken with a       cold forceps for histology. Impression:               - Esophageal mucosal abnormality. Biopsied.                           - Erythematous mucosa in the antrum. Biopsied.                           - A single non-bleeding angioectasia in the                            duodenum. Treated with argon plasma coagulation                            (APC).                           - Normal examined duodenum. Biopsied. Moderate Sedation:      Per Anesthesia Care Recommendation:           - Discharge patient to home (ambulatory).                           -  Resume previous diet.                           - Await pathology results.                           - Continue present medications.                           - Continue oral iron. Procedure Code(s):        --- Professional ---                           [phone], 59, Esophagogastroduodenoscopy, flexible,                            transoral; with control of bleeding, any method                           [id number],  Esophagogastroduodenoscopy, flexible,                            transoral; with biopsy, single or multiple Diagnosis Code(s):        --- Professional ---                           K22.8, Other specified diseases of esophagus                           K31.89, Other diseases of stomach and duodenum                           K31.819, Angiodysplasia of stomach and duodenum                            without bleeding                           D50.9, Iron deficiency anemia, unspecified CPT copyright 2019 American Medical Association. All rights reserved. The codes documented in this report are preliminary and upon coder review may  be revised to meet current compliance requirements. Katrinka Blazing, MD Katrinka Blazing,  04/08/2021 11:54:52 AM This report has been signed electronically. Number of Addenda: 0

## 2021-04-08 NOTE — Transfer of Care (Signed)
Immediate Anesthesia Transfer of Care Note  Patient: ADALEIGH WARF  Procedure(s) Performed: COLONOSCOPY WITH PROPOFOL ESOPHAGOGASTRODUODENOSCOPY (EGD) WITH PROPOFOL BIOPSY HOT HEMOSTASIS (ARGON PLASMA COAGULATION/BICAP)  Patient Location: Short Stay  Anesthesia Type:General  Level of Consciousness: awake, alert  and oriented  Airway & Oxygen Therapy: Patient Spontanous Breathing  Post-op Assessment: Report given to RN, Post -op Vital signs reviewed and stable and Patient moving all extremities X 4  Post vital signs: Reviewed and stable  Last Vitals:  Vitals Value Taken Time  BP    Temp    Pulse    Resp    SpO2      Last Pain:  Vitals:   04/08/21 1056  TempSrc:   PainSc: 0-No pain         Complications: No notable events documented.

## 2021-04-08 NOTE — Discharge Instructions (Addendum)
You are being discharged to home.  Resume your previous diet.  We are waiting for your pathology results.  Continue your present medications.  Continue oral iron. Your physician has recommended a repeat colonoscopy in 10 years for screening purposes.

## 2021-04-08 NOTE — Anesthesia Preprocedure Evaluation (Signed)
Anesthesia Evaluation  Patient identified by MRN, date of birth, ID band Patient awake    Reviewed: Allergy & Precautions, NPO status , Patient's Chart, lab work & pertinent test results  Airway Mallampati: II  TM Distance: >3 FB Neck ROM: Full    Dental  (+) Dental Advisory Given, Missing   Pulmonary former smoker,    Pulmonary exam normal breath sounds clear to auscultation       Cardiovascular Exercise Tolerance: Good hypertension, Pt. on medications + DOE  Normal cardiovascular exam Rhythm:Regular Rate:Normal     Neuro/Psych PSYCHIATRIC DISORDERS Anxiety Depression  Neuromuscular disease    GI/Hepatic Neg liver ROS, GERD  Medicated and Controlled,  Endo/Other  negative endocrine ROS  Renal/GU negative Renal ROS  negative genitourinary   Musculoskeletal  (+) Arthritis , Fibromyalgia -  Abdominal   Peds  Hematology  (+) anemia ,   Anesthesia Other Findings   Reproductive/Obstetrics                            Anesthesia Physical Anesthesia Plan  ASA: 2  Anesthesia Plan: General   Post-op Pain Management: Minimal or no pain anticipated   Induction: Intravenous  PONV Risk Score and Plan: TIVA  Airway Management Planned: Nasal Cannula and Natural Airway  Additional Equipment:   Intra-op Plan:   Post-operative Plan:   Informed Consent: I have reviewed the patients History and Physical, chart, labs and discussed the procedure including the risks, benefits and alternatives for the proposed anesthesia with the patient or authorized representative who has indicated his/her understanding and acceptance.       Plan Discussed with: CRNA and Surgeon  Anesthesia Plan Comments:        Anesthesia Quick Evaluation

## 2021-04-08 NOTE — Interval H&P Note (Signed)
History and Physical Interval Note:  04/08/2021 10:51 AM  Terri Alvarado  has presented today for surgery, with the diagnosis of Iron Deficiency Anemia Chronic Diarrhea.  The various methods of treatment have been discussed with the patient and family. After consideration of risks, benefits and other options for treatment, the patient has consented to  Procedure(s) with comments: COLONOSCOPY WITH PROPOFOL (N/A) - 12:00 ESOPHAGOGASTRODUODENOSCOPY (EGD) WITH PROPOFOL (N/A) as a surgical intervention.  The patient's history has been reviewed, patient examined, no change in status, stable for surgery.  I have reviewed the patient's chart and labs.  Questions were answered to the patient's satisfaction.     Katrinka Blazing Mayorga

## 2021-04-08 NOTE — Op Note (Signed)
[facility] Patient Name: Terri Alvarado Procedure Date: 04/08/2021 11:25 AM MRN: [id number] Date of Birth: February 01, 1956 Attending MD: Maylon Peppers ,  CSN: [id number] Age: 65 Admit Type: Outpatient Procedure:                Colonoscopy Indications:              Chronic diarrhea Providers:                Maylon Peppers, Rosina Lowenstein, RN, Raphael Gibney,                            Technician Referring MD:              Medicines:                Monitored Anesthesia Care Complications:            No immediate complications. Estimated Blood Loss:     Estimated blood loss: none. Procedure:                Pre-Anesthesia Assessment:                           - Prior to the procedure, a History and Physical                            was performed, and patient medications, allergies                            and sensitivities were reviewed. The patient's                            tolerance of previous anesthesia was reviewed.                           - The risks and benefits of the procedure and the                            sedation options and risks were discussed with the                            patient. All questions were answered and informed                            consent was obtained.                           - ASA Grade Assessment: II - A patient with mild                            systemic disease.                           After obtaining informed consent, the colonoscope                            was passed under direct vision. Throughout the  procedure, the patient's blood pressure, pulse, and                            oxygen saturations were monitored continuously. The                            [id number]-[id number] ([id number]) scope was introduced through                            the anus and advanced to the 10 cm into the ileum.                            The colonoscopy was performed without difficulty.                            The  patient tolerated the procedure well. The                            quality of the bowel preparation was good. Scope In: 11:29:17 AM Scope Out: 11:44:53 AM Scope Withdrawal Time: 0 hours 11 minutes 13 seconds  Total Procedure Duration: 0 hours 15 minutes 36 seconds  Findings:      Hemorrhoids were found on perianal exam.      The terminal ileum appeared normal.      The colon (entire examined portion) appeared normal. Biopsies for       histology were taken with a cold forceps from the right colon and left       colon for evaluation of microscopic colitis.      A few large-mouthed diverticula were found in the descending colon.      External internal hemorrhoids were found during retroflexion. The       hemorrhoids were small. Impression:               - Hemorrhoids found on perianal exam.                           - The examined portion of the ileum was normal.                           - The entire examined colon is normal. Biopsied.                           - Diverticulosis in the descending colon.                           - External internal hemorrhoids. Moderate Sedation:      Per Anesthesia Care Recommendation:           - Discharge patient to home (ambulatory).                           - Resume previous diet.                           - Await pathology results.                           -  Repeat colonoscopy in 10 years for screening                            purposes. Procedure Code(s):        --- Professional ---                           [phone], Colonoscopy, flexible; with biopsy, single                            or multiple Diagnosis Code(s):        --- Professional ---                           K64.4, Residual hemorrhoidal skin tags                           K64.8, Other hemorrhoids                           K52.9, Noninfective gastroenteritis and colitis,                            unspecified                           K57.30, Diverticulosis of large intestine  without                            perforation or abscess without bleeding CPT copyright 2019 American Medical Association. All rights reserved. The codes documented in this report are preliminary and upon coder review may  be revised to meet current compliance requirements. Maylon Peppers, MD Maylon Peppers,  04/08/2021 11:57:35 AM This report has been signed electronically. Number of Addenda: 0

## 2021-04-09 LAB — SURGICAL PATHOLOGY

## 2021-04-10 ENCOUNTER — Encounter (INDEPENDENT_AMBULATORY_CARE_PROVIDER_SITE_OTHER): Payer: Self-pay | Admitting: *Deleted

## 2021-04-10 ENCOUNTER — Encounter (INDEPENDENT_AMBULATORY_CARE_PROVIDER_SITE_OTHER): Payer: Self-pay

## 2021-04-10 ENCOUNTER — Encounter (HOSPITAL_COMMUNITY): Payer: Self-pay | Admitting: Gastroenterology

## 2021-08-11 IMAGING — RF DG LUMBAR SPINE COMPLETE 4+V
1 series · 4 of 4 positions shown · non-contrast
Comparison: Lumbar spine CT 07/23/2019.

CLINICAL DATA: L3-4 and L4-5 XLIF and L5-S1 ALIF.

EXAM:
OPERATIVE LUMBAR SPINE - 2 VIEW

[Series 1: run · 4 of 4 slices shown]
[im 1/4]
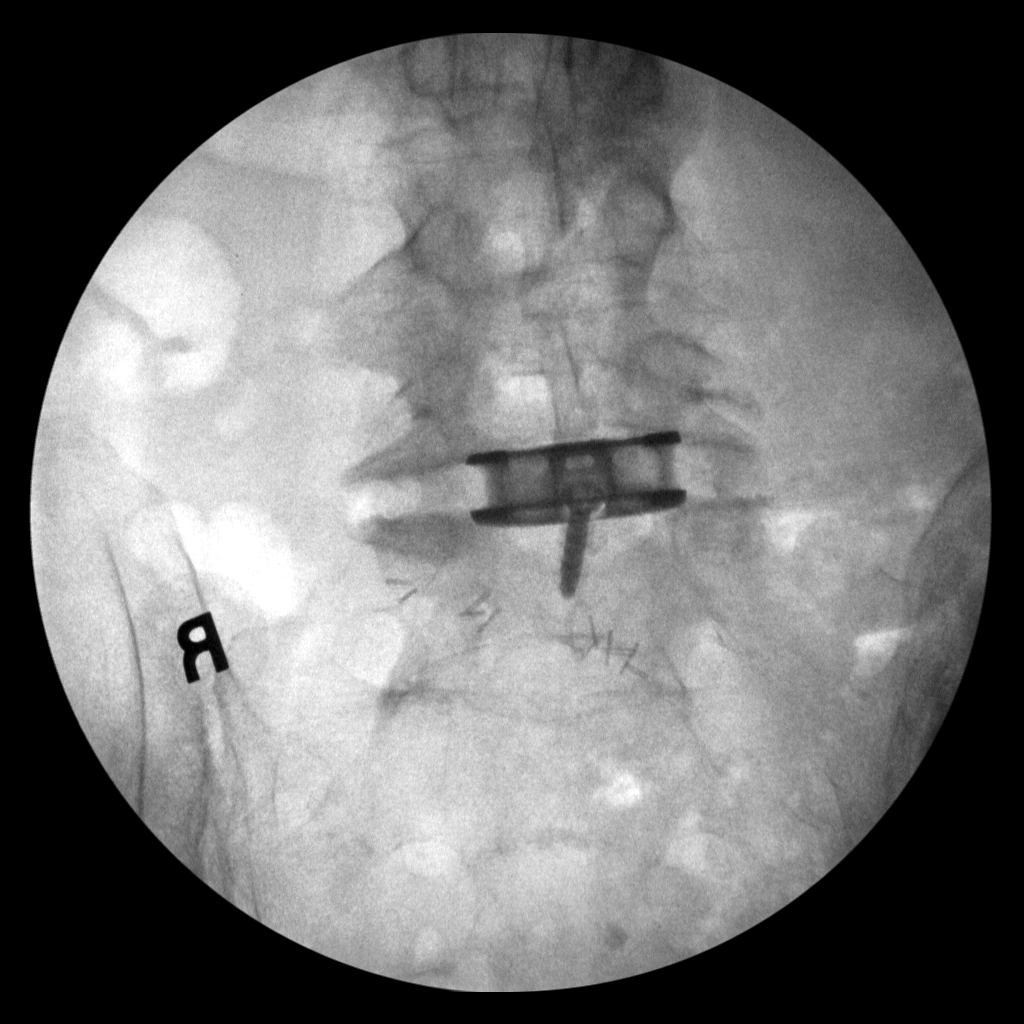
[im 2/4]
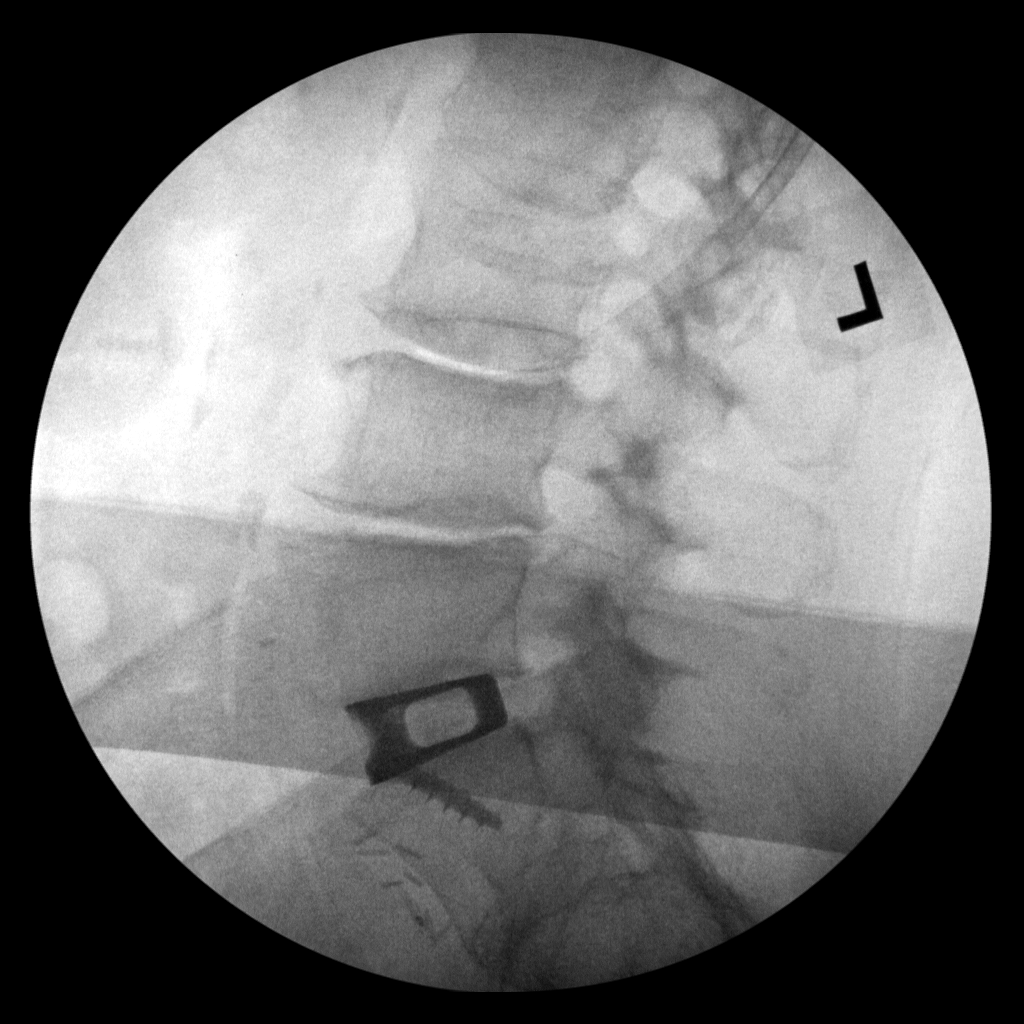
[im 3/4]
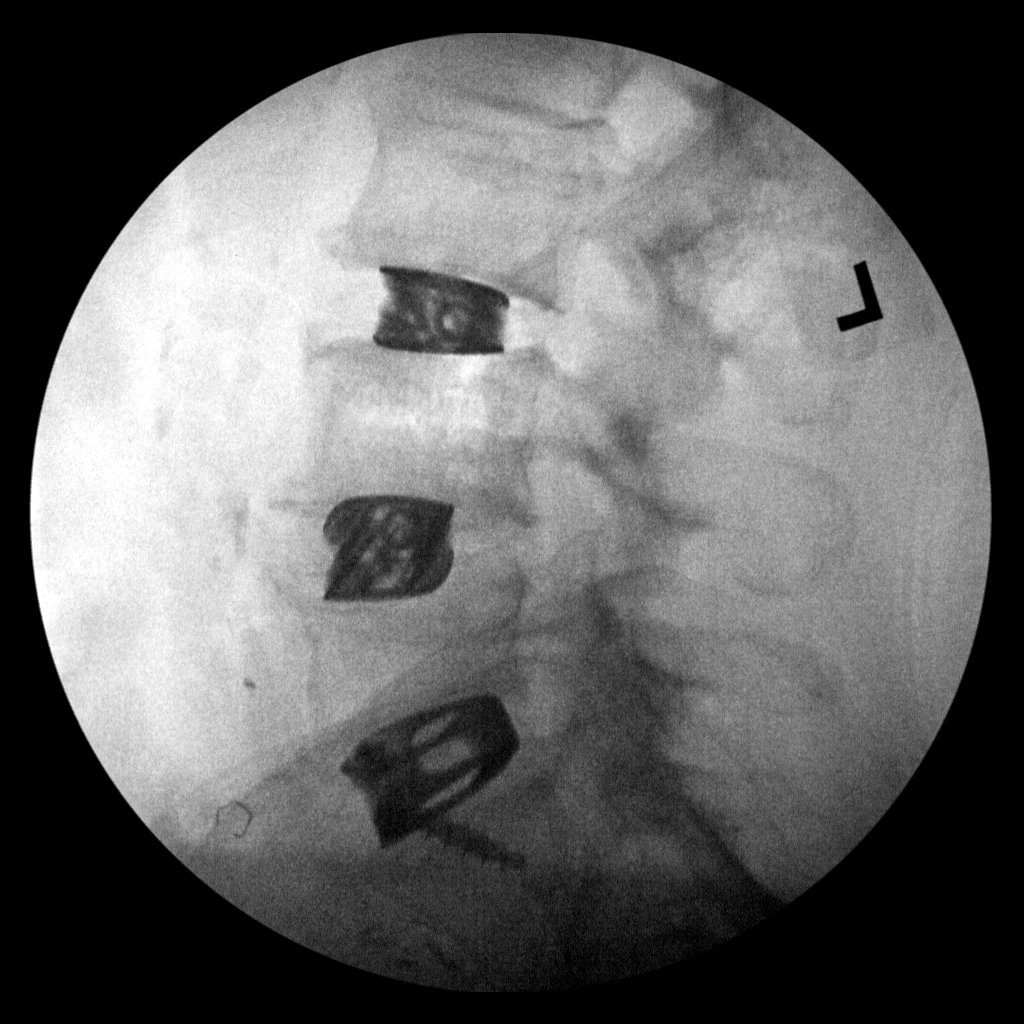
[im 4/4]
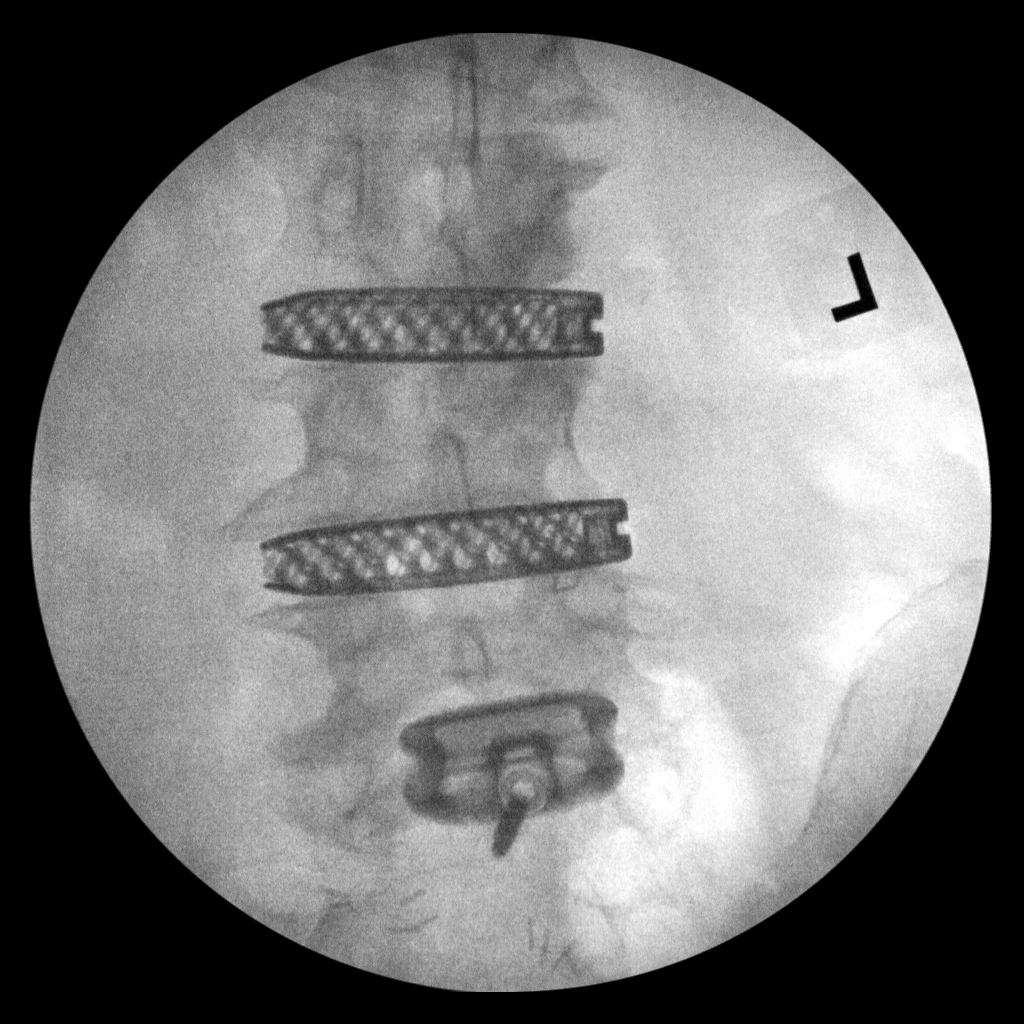

[4 of 4 positions shown; findings below may reference images not displayed]

FINDINGS: Three spot images from the C-arm fluoroscopic device, AP and LATERAL
views of the lumbar spine are submitted for interpretation
post-operatively. Interbody fusion at L3 and L4 with appropriately
positioned disc prostheses. L5-S1 ALIF with appropriate positioning
of the disc prosthesis.
IMPRESSION: L3-4 and L4-5 XLIF and L5-S1 ALIF without apparent complications.

## 2021-08-12 IMAGING — RF DG C-ARM 1-60 MIN
1 series · 4 of 4 positions shown · non-contrast
Comparison: September 12, 2019.

CLINICAL DATA: Surgical posterior fusion extending from L3-S1.

EXAM:
LUMBAR SPINE - 2-3 VIEW; DG C-ARM 1-60 MIN
FLUOROSCOPY TIME:  6 minutes 55 seconds.

[Series 1: run · 4 of 4 slices shown]
[im 1/4]
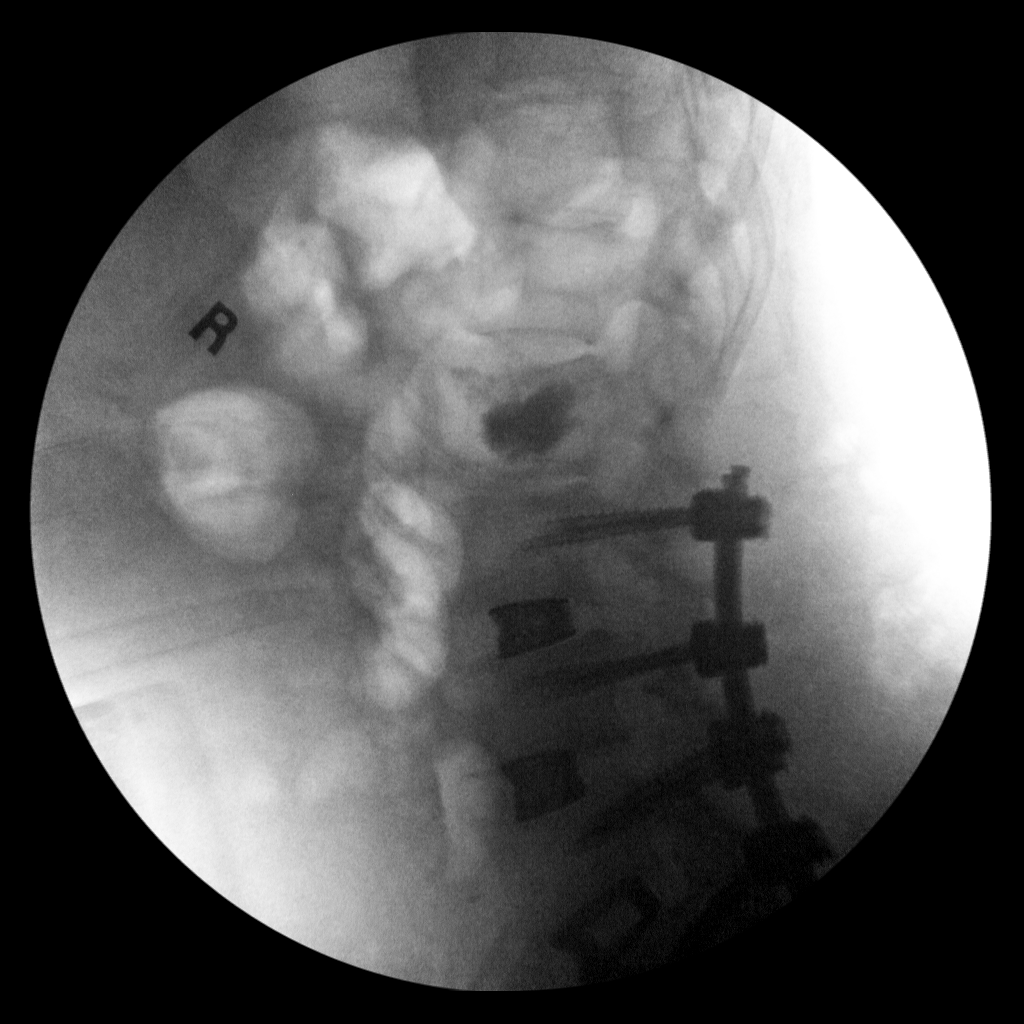
[im 2/4]
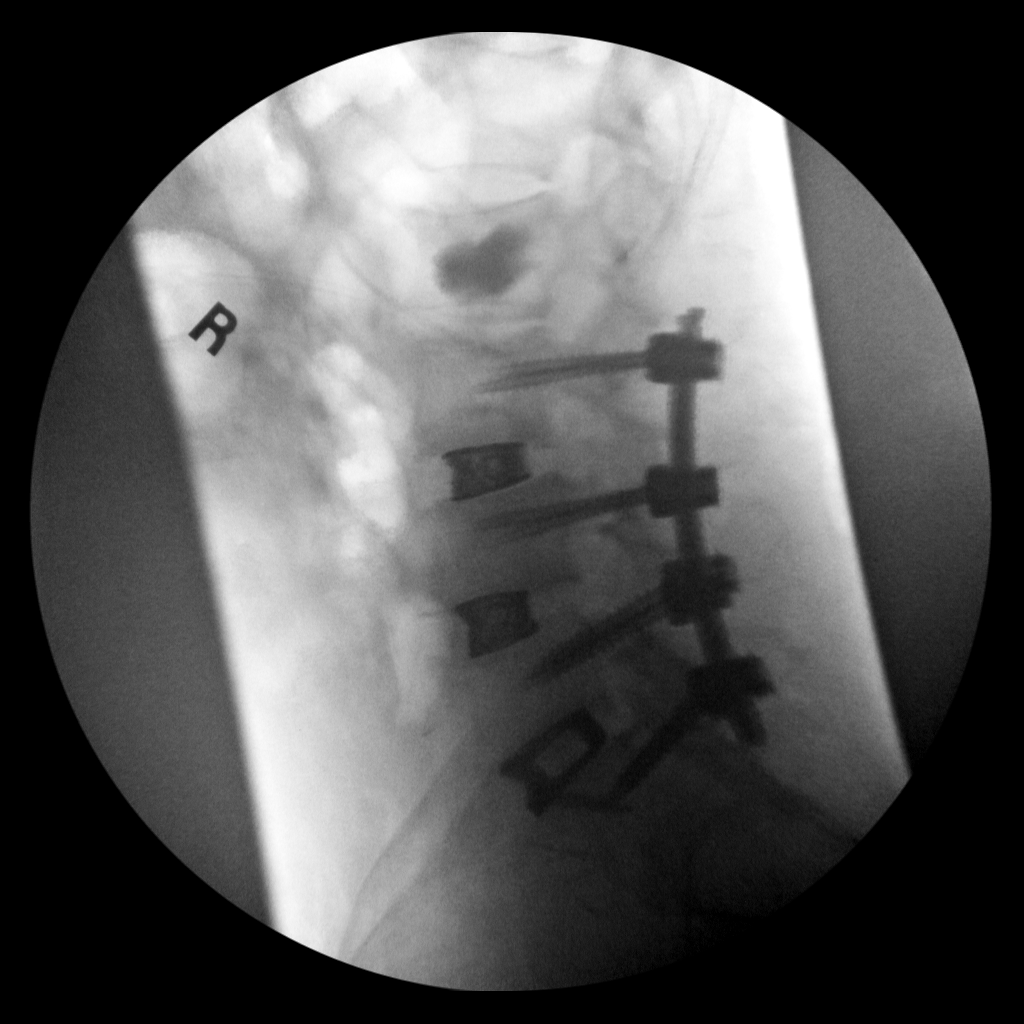
[im 3/4]
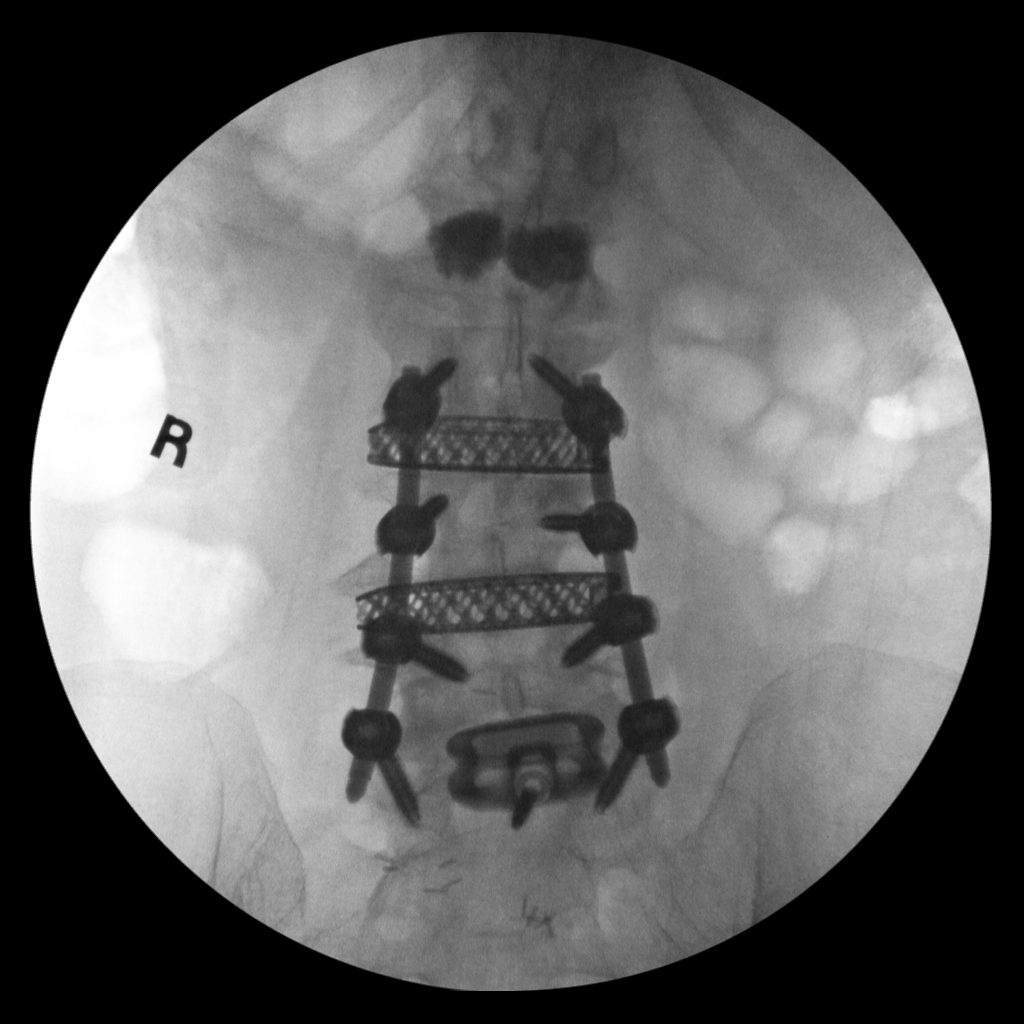
[im 4/4]
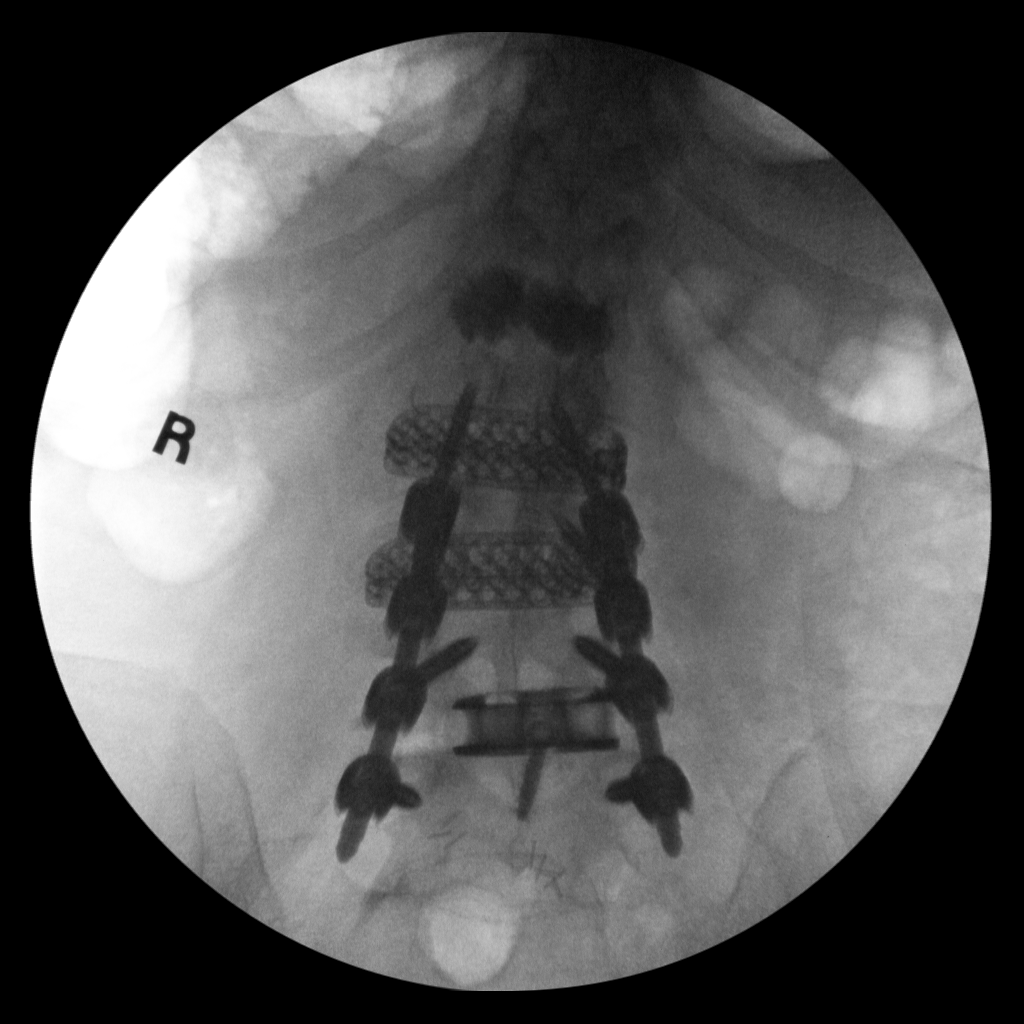

[4 of 4 positions shown; findings below may reference images not displayed]

FINDINGS: Four intraoperative fluoroscopic images were obtained of the lumbar
spine. Patient is status post surgical posterior fusion of L3-4,
L4-5 and L5-S1 with bilateral intrapedicular screw placement and
interbody fusion. Good alignment of vertebral bodies is noted.
IMPRESSION: Status post surgical posterior fusion of L3-4, L4-5 and L5-S1.

## 2021-11-13 ENCOUNTER — Ambulatory Visit (HOSPITAL_COMMUNITY)
Admission: RE | Admit: 2021-11-13 | Discharge: 2021-11-13 | Disposition: A | Discharge status: Home / Self Care | Payer: Medicare Other | Source: Ambulatory Visit | Attending: Emergency Medicine | Admitting: Emergency Medicine

## 2021-11-13 DIAGNOSIS — R911 Solitary pulmonary nodule: Secondary | ICD-10-CM | POA: Diagnosis present

## 2022-03-14 ENCOUNTER — Encounter (INDEPENDENT_AMBULATORY_CARE_PROVIDER_SITE_OTHER): Payer: Self-pay | Admitting: Gastroenterology

## 2022-06-03 ENCOUNTER — Emergency Department (HOSPITAL_COMMUNITY)
Admission: EM | Admit: 2022-06-03 | Discharge: 2022-06-03 | Disposition: A | Discharge status: Home / Self Care | Payer: Medicare Other | Attending: Emergency Medicine | Admitting: Emergency Medicine

## 2022-06-03 ENCOUNTER — Emergency Department (HOSPITAL_COMMUNITY): Payer: Medicare Other

## 2022-06-03 ENCOUNTER — Encounter (HOSPITAL_COMMUNITY): Payer: Self-pay

## 2022-06-03 ENCOUNTER — Other Ambulatory Visit: Payer: Self-pay

## 2022-06-03 DIAGNOSIS — S60911A Unspecified superficial injury of right wrist, initial encounter: Secondary | ICD-10-CM | POA: Diagnosis present

## 2022-06-03 DIAGNOSIS — I10 Essential (primary) hypertension: Secondary | ICD-10-CM | POA: Diagnosis not present

## 2022-06-03 DIAGNOSIS — W010XXA Fall on same level from slipping, tripping and stumbling without subsequent striking against object, initial encounter: Secondary | ICD-10-CM | POA: Diagnosis not present

## 2022-06-03 DIAGNOSIS — Z79899 Other long term (current) drug therapy: Secondary | ICD-10-CM | POA: Insufficient documentation

## 2022-06-03 DIAGNOSIS — S52571A Other intraarticular fracture of lower end of right radius, initial encounter for closed fracture: Secondary | ICD-10-CM | POA: Insufficient documentation

## 2022-06-03 MED ORDER — ONDANSETRON HCL 4 MG/2ML IJ SOLN
4.0000 mg | Freq: Once | INTRAMUSCULAR | Status: AC
  Administered 2022-06-03: 4 mg via INTRAVENOUS
  Filled 2022-06-03: qty 2

## 2022-06-03 MED ORDER — MORPHINE SULFATE (PF) 4 MG/ML IV SOLN
4.0000 mg | Freq: Once | INTRAVENOUS | Status: AC
  Administered 2022-06-03: 4 mg via INTRAVENOUS
  Filled 2022-06-03: qty 1

## 2022-06-03 MED ORDER — IBUPROFEN 800 MG PO TABS
800.0000 mg | ORAL_TABLET | Freq: Once | ORAL | Status: AC
  Administered 2022-06-03: 800 mg via ORAL
  Filled 2022-06-03: qty 1

## 2022-06-03 MED ORDER — HYDROCODONE-ACETAMINOPHEN 5-325 MG PO TABS: 1.0000 | ORAL_TABLET | Freq: Four times a day (QID) | ORAL | 0 refills | Status: AC | PRN

## 2022-06-03 NOTE — Discharge Instructions (Addendum)
You have been seen today for your complaint of right wrist pain. Your imaging showed that you have fractured your right wrist. Your discharge medications include Alternate tylenol and ibuprofen for pain. You may alternate these every 4 hours. You may take up to 800 mg of ibuprofen at a time and up to 1000 mg of tylenol. Home care instructions are as follows:  Ice the wrist through the brace multiple times a day Follow up with: Dr. Aline Brochure.  He is an Doctor, general practice.  You should call him tomorrow to schedule an appointment for an ED follow-up visit. Please seek immediate medical care if you develop any of the following symptoms: You cannot move your fingers. You have severe pain, especially if the pain changes significantly or suddenly. Your fingers or your hand: Become numb, cold, or pale. Turn a bluish color. At this time there does not appear to be the presence of an emergent medical condition, however there is always the potential for conditions to change. Please read and follow the below instructions.  Do not take your medicine if  develop an itchy rash, swelling in your mouth or lips, or difficulty breathing; call 911 and seek immediate emergency medical attention if this occurs.  You may review your lab tests and imaging results in their entirety on your MyChart account.  Please discuss all results of fully with your primary care provider and other specialist at your follow-up visit.  Note: Portions of this text may have been transcribed using voice recognition software. Every effort was made to ensure accuracy; however, inadvertent computerized transcription errors may still be present.

## 2022-06-03 NOTE — ED Provider Notes (Signed)
[facility] Provider Note   CSN: [id number] Arrival date & time: 06/03/22  1528     History  Chief Complaint  Patient presents with   Wrist Injury    Terri Alvarado is a 67 y.o. female.  With a history of hypertension, anxiety, depression who presents to the ED for evaluation of right wrist pain.  She states she was working in her shed and fell on outstretched hands approximately 90 minutes prior to arrival.  She reported instant pain in her right wrist.  She then called the ambulance and was transferred to the ED.  She has not taken anything for her pain.  Denies numbness or tingling.  States the pain is severe.  It is difficult to make a fist.  She does not take blood thinners.  She did not hit her head or lose consciousness.  She is not complaining of pain anywhere besides her right wrist.  Specifically denies right elbow, shoulder, left wrist pain.   Wrist Injury      Home Medications Prior to Admission medications   Medication Sig Start Date End Date Taking? Authorizing Provider  HYDROcodone-acetaminophen (NORCO/VICODIN) 5-325 MG tablet Take 1 tablet by mouth every 6 (six) hours as needed. 06/03/22  Yes Nashley Cordoba, Grafton Folk, PA-C  DULoxetine (CYMBALTA) 30 MG capsule Take 90 mg by mouth daily.    [provider]  ferrous sulfate 325 (65 FE) MG EC tablet Take 325 mg by mouth daily with breakfast. 11/18/20 11/18/21  [provider]  lisinopril-hydrochlorothiazide (ZESTORETIC) 20-12.5 MG tablet Take 1 tablet by mouth daily.  04/19/19   [provider]  Multiple Vitamins-Minerals (MULTIVITAMIN WITH MINERALS) tablet Take 1 tablet by mouth daily. Centrum Silver    [provider]  pantoprazole (PROTONIX) 40 MG tablet Take 40 mg by mouth daily. 12/04/20   [provider]  sulfamethoxazole-trimethoprim (BACTRIM DS) 800-160 MG tablet Take 1 tablet by mouth daily. 02/07/21   [provider]   tretinoin (RETIN-A) 0.025 % cream Apply 1 application topically at bedtime.    [provider]      Allergies    Patient has no known allergies.    Review of Systems   Review of Systems  Musculoskeletal:  Positive for arthralgias.  All other systems reviewed and are negative.   Physical Exam Updated Vital Signs BP 132/79 (BP Location: Left Arm)   Pulse 68   Temp (!) 97.5 F (36.4 C) (Oral)   Resp 16   Ht 5\' 3"  (1.6 m)   Wt 72.6 kg   SpO2 95%   BMI 28.34 kg/m  Physical Exam Vitals and nursing note reviewed.  Constitutional:      General: She is not in acute distress.    Appearance: Normal appearance. She is normal weight. She is not ill-appearing.  HENT:     Head: Normocephalic and atraumatic.  Pulmonary:     Effort: Pulmonary effort is normal. No respiratory distress.  Abdominal:     General: Abdomen is flat.  Musculoskeletal:     Cervical back: Neck supple.     Comments: Obvious deformity right wrist with overlying bruising.  Capillary refill normal.  Radial pulse 2+.  Sensation intact.  Finger movements normal.  Unable to make a fist secondary to pain.  No overlying wounds  Skin:    General: Skin is warm and dry.     Capillary Refill: Capillary refill takes less than 2 seconds.  Neurological:  Mental Status: She is alert and oriented to person, place, and time.  Psychiatric:        Mood and Affect: Mood normal.        Behavior: Behavior normal.     ED Results / Procedures / Treatments   Labs (all labs ordered are listed, but only abnormal results are displayed) Labs Reviewed - No data to display  EKG None  Radiology DG Wrist Complete Right  Result Date: 06/03/2022 CLINICAL DATA:  Tripped of said. Injury. Right wrist pain and swelling. EXAM: RIGHT WRIST - COMPLETE 3+ VIEW; RIGHT FOREARM - 2 VIEW COMPARISON:  None Available. FINDINGS: Right wrist: There is diffuse decreased bone mineralization. There is a transverse fracture of the distal  radial metaphysis with extension through the medial, lateral, dorsal, and volar cortices. Up to 3 mm lateral cortical step-off of the distal fracture component on frontal view. Up to 7 mm volar and 7 mm dorsal cortical step-off of the distal fracture components on lateral view. Mild-to-moderate fracture impaction. Neutral ulnar variance. Chronic appearing ossicles overlie the triangular fibrocartilage complex. Severe thumb carpometacarpal joint space narrowing, subchondral sclerosis, and peripheral osteophytosis. Severe triscaphe joint space narrowing and subchondral sclerosis. Mild relative widening of the scapholunate interval up to 2.5 mm. Moderate second and mild third metacarpal head volar degenerative spurring. Moderate regional soft tissue swelling. -- Right forearm: No additional acute fracture is seen within the more proximal radius or ulna. Mild to moderate radial head-neck junction circumferential degenerative osteophytes. Mild-to-moderate tip of the coronoid process and tip of the olecranon degenerative spurring at the elbow. IMPRESSION: 1. Acute transverse fracture of the distal radial metaphysis with mild-to-moderate fracture impaction. 2. Mild relative widening of the scapholunate interval, possibly from age indeterminate scapholunate ligament injury. Electronically Signed   By: Yvonne Kendall M.D.   On: 06/03/2022 16:36   DG Forearm Right  Result Date: 06/03/2022 CLINICAL DATA:  Tripped of said. Injury. Right wrist pain and swelling. EXAM: RIGHT WRIST - COMPLETE 3+ VIEW; RIGHT FOREARM - 2 VIEW COMPARISON:  None Available. FINDINGS: Right wrist: There is diffuse decreased bone mineralization. There is a transverse fracture of the distal radial metaphysis with extension through the medial, lateral, dorsal, and volar cortices. Up to 3 mm lateral cortical step-off of the distal fracture component on frontal view. Up to 7 mm volar and 7 mm dorsal cortical step-off of the distal fracture components on  lateral view. Mild-to-moderate fracture impaction. Neutral ulnar variance. Chronic appearing ossicles overlie the triangular fibrocartilage complex. Severe thumb carpometacarpal joint space narrowing, subchondral sclerosis, and peripheral osteophytosis. Severe triscaphe joint space narrowing and subchondral sclerosis. Mild relative widening of the scapholunate interval up to 2.5 mm. Moderate second and mild third metacarpal head volar degenerative spurring. Moderate regional soft tissue swelling. -- Right forearm: No additional acute fracture is seen within the more proximal radius or ulna. Mild to moderate radial head-neck junction circumferential degenerative osteophytes. Mild-to-moderate tip of the coronoid process and tip of the olecranon degenerative spurring at the elbow. IMPRESSION: 1. Acute transverse fracture of the distal radial metaphysis with mild-to-moderate fracture impaction. 2. Mild relative widening of the scapholunate interval, possibly from age indeterminate scapholunate ligament injury. Electronically Signed   By: Yvonne Kendall M.D.   On: 06/03/2022 16:36    Procedures Procedures    Medications Ordered in ED Medications  morphine (PF) 4 MG/ML injection 4 mg (4 mg Intravenous Given 06/03/22 1557)  ondansetron (ZOFRAN) injection 4 mg (4 mg Intravenous Given 06/03/22 1557)  morphine (  PF) 4 MG/ML injection 4 mg (4 mg Intravenous Given 06/03/22 1643)  ibuprofen (ADVIL) tablet 800 mg (800 mg Oral Given 06/03/22 1745)    ED Course/ Medical Decision Making/ A&P Clinical Course as of 06/03/22 1754  Thu Jun 03, 2022  1655 DG Wrist Complete Right I personally reviewed and interpreted the image.  Distal radius fracture with impaction [AS]  2353 Spoke with orthopedics Dr. Aline Brochure.  He recommends sugar-tong splint, pain management and follow-up outpatient [AS]    Clinical Course User Index [AS] Bryona Foxworthy, Grafton Folk, PA-C                             Medical Decision Making Amount and/or  Complexity of Data Reviewed Radiology: ordered. Decision-making details documented in ED Course.  Risk Prescription drug management.  This patient presents to the ED for concern of right wrist pain, this involves an extensive number of treatment options, and is a complaint that carries with it a high risk of complications and morbidity.  The differential diagnosis includes fracture, dislocation, strain, sprain  My initial workup includes x-ray right wrist, pain control  Additional history obtained from: Nursing notes from this visit.  I ordered imaging studies including x-ray right wrist I independently visualized and interpreted imaging which showed distal radius fracture with impaction I agree with the radiologist interpretation  Consultations Obtained:  I requested consultation with the orthopedic surgery Dr. Aline Brochure,  and discussed lab and imaging findings as well as pertinent plan - they recommend: Sugar-tong splints, pain control and follow-up outpatient  Afebrile, hemodynamically stable.  67 year old female presents ED for evaluation of right wrist pain after a fall.  On exam, there is obvious deformity and significant tenderness to palpation.  Neurovascular status is intact.  X-rays significant for right distal radius fracture with impaction.  Orthopedics was consulted and recommends as above.  Patient was placed in sugar-tong splint and prescription for Norco was sent as she has taken this in the past with good results.  Sling was offered but patient declined.  She was given contact information for orthopedic follow-up and encouraged to call tomorrow for an appointment.  She was given return precautions.  Stable at discharge.  At this time there does not appear to be any evidence of an acute emergency medical condition and the patient appears stable for discharge with appropriate outpatient follow up. Diagnosis was discussed with patient who verbalizes understanding of care plan and  is agreeable to discharge. I have discussed return precautions with patient and stepdaughter who verbalizes understanding. Patient encouraged to follow-up with their PCP within 1 week. All questions answered.  Patient's case discussed with Dr. Alvino Chapel who agrees with plan to discharge with follow-up.   Note: Portions of this report may have been transcribed using voice recognition software. Every effort was made to ensure accuracy; however, inadvertent computerized transcription errors may still be present.        Final Clinical Impression(s) / ED Diagnoses Final diagnoses:  Other closed intra-articular fracture of distal end of right radius, initial encounter    Rx / DC Orders ED Discharge Orders          Ordered    HYDROcodone-acetaminophen (NORCO/VICODIN) 5-325 MG tablet  Every 6 hours PRN        06/03/22 1714              Terri Alvarado 06/03/22 1754    Davonna Belling, MD 06/03/22 2306

## 2022-06-03 NOTE — ED Triage Notes (Signed)
Pt was in the process of moving and tripped out of her shed and has right wrist pain, swelling.

## 2023-01-31 ENCOUNTER — Encounter (INDEPENDENT_AMBULATORY_CARE_PROVIDER_SITE_OTHER): Payer: Self-pay | Admitting: Gastroenterology

## 2023-01-31 ENCOUNTER — Ambulatory Visit (INDEPENDENT_AMBULATORY_CARE_PROVIDER_SITE_OTHER): Payer: Medicare Other | Admitting: Gastroenterology

## 2023-01-31 VITALS — BP 111/69 | HR 94 | Temp 97.1°F | Ht 63.0 in | Wt 159.3 lb

## 2023-01-31 DIAGNOSIS — K529 Noninfective gastroenteritis and colitis, unspecified: Secondary | ICD-10-CM

## 2023-01-31 DIAGNOSIS — K5520 Angiodysplasia of colon without hemorrhage: Secondary | ICD-10-CM | POA: Diagnosis not present

## 2023-01-31 DIAGNOSIS — K219 Gastro-esophageal reflux disease without esophagitis: Secondary | ICD-10-CM | POA: Diagnosis not present

## 2023-01-31 DIAGNOSIS — D509 Iron deficiency anemia, unspecified: Secondary | ICD-10-CM

## 2023-01-31 NOTE — Patient Instructions (Signed)
Continue pantoprazole 40 mg qday If presenting recurrent diarrhea, can restart Imodium as needed

## 2023-01-31 NOTE — Progress Notes (Signed)
Terri Alvarado, M.D. Gastroenterology & Hepatology [facility] Gastroenterology [address] [place], [place] [address]  Primary Care Physician: Donetta Potts, MD [address] [place] [address]  I will communicate my assessment and recommendations to the referring MD via EMR.  Problems: GERD Iron-deficiency anemia Chronic diarrhea, likely functional  History of Present Illness: Terri Alvarado is a 67 y.o. female with past medical history of GERD, IBS-D, anxiety, iron deficiency anemia, depression, hypertension, spinal stenosis, who presents for follow up of GERD, IDA and chronic diarrhea.  The patient was last seen on 03/12/2021. At that time, the patient was advised to continue Imodium for diarrhea as needed.  She was continued on pantoprazole 40 mg every day.  Also advised to continue oral iron twice a day for iron deficiency anemia.  EGD and colonoscopy were performed with findings described below.  Patient reports since the her bowels have much more improved. They are more formed, has a 1-2 Bms per day.. Does not take any Imodium.  The patient denies having any nausea, vomiting, fever, chills, hematochezia, melena, hematemesis, abdominal distention, abdominal pain, diarrhea, jaundice, pruritus, significant dysphagia or weight loss.  Patient takes pantoprazole 40 mg qday , which controls her heartburn adequately. Very seldom has heartburn episodes.  No recent labs available. States that 6 months ago had blood workup checked and it was "fine". She is not taking iron consistently.  Last EGD: 04/08/2021 Presence of abnormal paperclip a appearance in the esophagus diffusely (normal esophageal biopsies), erythema in the gastric antrum (slight chronic inflammation, negative for H. pylori), 1 small AVM was found in third portion of the duodenum which was ablated.  Normal rest of the duodenum which was negative for any inflammation in biopsies.  Last  Colonoscopy: 04/08/2021 Hemorrhoids, normal terminal ileum, normal colon (normal random colonic biopsies), diverticulosis in the descending colon.  External and internal hemorrhoids.  Advised to repeat colonoscopy in 10 years.  Past Medical History: Past Medical History:  Diagnosis Date   Anxiety    Degeneration of lumbar intervertebral disc    Degenerative scoliosis    Depression    GERD (gastroesophageal reflux disease)    Hypertension    Low back pain    Lumbar radiculopathy    Lumbar spondylosis    Neurogenic claudication    Pain in joint of right shoulder    Pain of left hip joint    Rotator cuff arthropathy of right shoulder    Spinal stenosis     Past Surgical History: Past Surgical History:  Procedure Laterality Date   ABDOMINAL EXPOSURE N/A 09/12/2019   Procedure: ABDOMINAL EXPOSURE;  Surgeon: Nada Libman, MD;  Location: [facility] OR;  Service: Vascular;  Laterality: N/A;   ANTERIOR LUMBAR FUSION N/A 09/12/2019   Procedure: ANTERIOR LUMBAR FUSION LUMBAR FIVE-SACRAL ONE  ANTERIOR LATERAL LUMBAR FUSION LUMBAR THREE-LUMBAR FIVE;  Surgeon: Venita Lick, MD;  Location: [facility] OR;  Service: Orthopedics;  Laterality: N/A;  5 hrs Left sided tap block with exparel Dr. Myra Gianotti to do approach   BACK SURGERY  02/2019   BIOPSY  04/08/2021   Procedure: BIOPSY;  Surgeon: Dolores Frame, MD;  Location: [facility];  Service: Gastroenterology;;   CARPAL TUNNEL RELEASE  2017   left hand   COLONOSCOPY WITH PROPOFOL N/A 04/08/2021   Procedure: COLONOSCOPY WITH PROPOFOL;  Surgeon: Dolores Frame, MD;  Location: [facility];  Service: Gastroenterology;  Laterality: N/A;  12:00   ESOPHAGOGASTRODUODENOSCOPY (EGD) WITH PROPOFOL  N/A 04/08/2021   Procedure: ESOPHAGOGASTRODUODENOSCOPY (EGD) WITH PROPOFOL;  Surgeon: Dolores Frame, MD;  [facility]: [facility];  Service: Gastroenterology;  Laterality: N/A;   HOT HEMOSTASIS  04/08/2021   Procedure: HOT HEMOSTASIS  (ARGON PLASMA COAGULATION/BICAP);  Surgeon: Marguerita Merles, Reuel Boom, MD;  Location: [facility];  Service: Gastroenterology;;   POSTERIOR LUMBAR FUSION 4 LEVEL N/A 09/13/2019   Procedure: POSTERIOR LUMBAR FUSION INTERBODY LUMBAR THREE - SACRAL ONE, LUMBAR THREE KYPHOPLASTY;  Surgeon: Venita Lick, MD;  Location: [facility] OR;  Service: Orthopedics;  Laterality: N/A;  4 hrs   REVERSE SHOULDER ARTHROPLASTY Right 05/24/2019   Procedure: REVERSE SHOULDER ARTHROPLASTY SDDC;  Surgeon: Francena Hanly, MD;  Location: [facility];  Service: Orthopedics;  Laterality: Right;    SHOULDER SURGERY     left shoulder   TUBAL LIGATION      Family History: Family History  Problem Relation Age of Onset   Cancer Father    Bladder Cancer Brother    Healthy Son    Healthy Daughter     Social History: Social History   Tobacco Use  Smoking Status Former   Current packs/day: 0.00   Average packs/day: 1 pack/day for 35.0 years (35.0 ttl pk-yrs)   Types: Cigarettes   Start date: 06/12/1978   Quit date: 06/12/2013   Years since quitting: 9.6  Smokeless Tobacco Never   Social History   Substance and Sexual Activity  Alcohol Use Yes   Comment: occassionally   Social History   Substance and Sexual Activity  Drug Use Never    Allergies: No Known Allergies  Medications: Current Outpatient Medications  Medication Sig Dispense Refill   DULoxetine (CYMBALTA) 30 MG capsule Take 90 mg by mouth daily.     ferrous sulfate 325 (65 FE) MG EC tablet Take 325 mg by mouth daily with breakfast.     HYDROcodone-acetaminophen (NORCO/VICODIN) 5-325 MG tablet Take 1 tablet by mouth every 6 (six) hours as needed. 10 tablet 0   lisinopril-hydrochlorothiazide (ZESTORETIC) 20-12.5 MG tablet Take 1 tablet by mouth daily.      pantoprazole (PROTONIX) 40 MG tablet Take 40 mg by mouth daily.     tretinoin (RETIN-A) 0.025 % cream Apply 1 application topically at bedtime.     No current facility-administered medications  for this visit.    Review of Systems: GENERAL: negative for malaise, night sweats HEENT: No changes in hearing or vision, no nose bleeds or other nasal problems. NECK: Negative for lumps, goiter, pain and significant neck swelling RESPIRATORY: Negative for cough, wheezing CARDIOVASCULAR: Negative for chest pain, leg swelling, palpitations, orthopnea GI: SEE HPI MUSCULOSKELETAL: Negative for joint pain or swelling, back pain, and muscle pain. SKIN: Negative for lesions, rash PSYCH: Negative for sleep disturbance, mood disorder and recent psychosocial stressors. HEMATOLOGY Negative for prolonged bleeding, bruising easily, and swollen nodes. ENDOCRINE: Negative for cold or heat intolerance, polyuria, polydipsia and goiter. NEURO: negative for tremor, gait imbalance, syncope and seizures. The remainder of the review of systems is noncontributory.   Physical Exam: BP 111/69 (BP Location: Left Arm, Patient Position: Sitting, Cuff Size: Normal)   Pulse 94   Temp (!) 97.1 F (36.2 C) (Temporal)   Ht 5\' 3"  (1.6 m)   Wt 159 lb 4.8 oz (72.3 kg)   BMI 28.22 kg/m  GENERAL: The patient is AO x3, in no acute distress. HEENT: Head is normocephalic and atraumatic. EOMI are intact. Mouth is well hydrated and without lesions. NECK: Supple. No masses LUNGS: Clear to auscultation.  No presence of rhonchi/wheezing/rales. Adequate chest expansion HEART: RRR, normal s1 and s2. ABDOMEN: Soft, nontender, no guarding, no peritoneal signs, and nondistended. BS +. No masses. EXTREMITIES: Without any cyanosis, clubbing, rash, lesions or edema. NEUROLOGIC: AOx3, no focal motor deficit. SKIN: no jaundice, no rashes  Imaging/Labs: as above  I personally reviewed and interpreted the available labs, imaging and endoscopic files.  Impression and Plan: KIERSTYN BARANOWSKI is a 67 y.o. female with past medical history of GERD, IBS-D, anxiety, iron deficiency anemia, depression, hypertension, spinal stenosis, who  presents for follow up of GERD, IDA and chronic diarrhea.  In terms of her diarrhea, her symptoms have improved on its own and is not needing to take any Imodium.  If she were to have any recurrent bouts of diarrhea, she can use this medication as needed.  Her GERD has been adequately controlled with the use of pantoprazole every day, which she should continue using it on a regular basis.  Patient has not presented any overt gastrointestinal bleeding.  Patient will follow-up with her PCP regarding her anemia.  Will request the most recent labs and may discuss repeating an enteroscopy if she presents persistent anemia.  -Continue pantoprazole 40 mg qday -If presenting recurrent diarrhea, can restart Imodium as needed -Will request labs from PCP, may consider enteroscopy if recurrent anemia  All questions were answered.      Terri Blazing, MD Gastroenterology and Hepatology [facility] Gastroenterology

## 2023-02-25 ENCOUNTER — Other Ambulatory Visit: Payer: Self-pay | Admitting: Medical Genetics

## 2023-02-25 DIAGNOSIS — Z006 Encounter for examination for normal comparison and control in clinical research program: Secondary | ICD-10-CM

## 2023-02-28 ENCOUNTER — Other Ambulatory Visit (HOSPITAL_COMMUNITY)
Admission: RE | Admit: 2023-02-28 | Discharge: 2023-02-28 | Disposition: A | Discharge status: Home / Self Care | Payer: Medicare Other | Source: Ambulatory Visit | Attending: Medical Genetics | Admitting: Medical Genetics

## 2023-02-28 DIAGNOSIS — Z006 Encounter for examination for normal comparison and control in clinical research program: Secondary | ICD-10-CM | POA: Insufficient documentation

## 2023-03-08 LAB — HELIX MOLECULAR SCREEN: Genetic Analysis Overall Interpretation: NEGATIVE

## 2023-03-08 LAB — GENECONNECT MOLECULAR SCREEN

## 2023-06-24 ENCOUNTER — Encounter: Payer: Self-pay | Admitting: Emergency Medicine

## 2023-11-17 ENCOUNTER — Encounter (HOSPITAL_COMMUNITY): Payer: Self-pay

## 2023-11-17 ENCOUNTER — Other Ambulatory Visit: Payer: Self-pay

## 2023-11-17 ENCOUNTER — Observation Stay (HOSPITAL_COMMUNITY)
Admission: EM | Admit: 2023-11-17 | Discharge: 2023-11-18 | Disposition: A | Discharge status: Home / Self Care | Source: Ambulatory Visit | Attending: Family Medicine | Admitting: Family Medicine

## 2023-11-17 ENCOUNTER — Emergency Department (HOSPITAL_COMMUNITY)

## 2023-11-17 DIAGNOSIS — F1090 Alcohol use, unspecified, uncomplicated: Secondary | ICD-10-CM | POA: Insufficient documentation

## 2023-11-17 DIAGNOSIS — I10 Essential (primary) hypertension: Secondary | ICD-10-CM | POA: Diagnosis not present

## 2023-11-17 DIAGNOSIS — R911 Solitary pulmonary nodule: Secondary | ICD-10-CM | POA: Insufficient documentation

## 2023-11-17 DIAGNOSIS — K219 Gastro-esophageal reflux disease without esophagitis: Secondary | ICD-10-CM | POA: Insufficient documentation

## 2023-11-17 DIAGNOSIS — R079 Chest pain, unspecified: Secondary | ICD-10-CM | POA: Diagnosis present

## 2023-11-17 DIAGNOSIS — J9601 Acute respiratory failure with hypoxia: Secondary | ICD-10-CM | POA: Insufficient documentation

## 2023-11-17 DIAGNOSIS — Z7982 Long term (current) use of aspirin: Secondary | ICD-10-CM | POA: Insufficient documentation

## 2023-11-17 DIAGNOSIS — R0602 Shortness of breath: Secondary | ICD-10-CM | POA: Diagnosis present

## 2023-11-17 DIAGNOSIS — J189 Pneumonia, unspecified organism: Secondary | ICD-10-CM | POA: Diagnosis not present

## 2023-11-17 DIAGNOSIS — Z87891 Personal history of nicotine dependence: Secondary | ICD-10-CM | POA: Diagnosis not present

## 2023-11-17 LAB — CBC WITH DIFFERENTIAL/PLATELET
Abs Immature Granulocytes: 0.04 K/uL (ref 0.00–0.07)
Basophils Absolute: 0 K/uL (ref 0.0–0.1)
Basophils Relative: 0 %
Eosinophils Absolute: 0.2 K/uL (ref 0.0–0.5)
Eosinophils Relative: 3 %
HCT: 34.8 % — ABNORMAL LOW (ref 36.0–46.0)
Hemoglobin: 10.8 g/dL — ABNORMAL LOW (ref 12.0–15.0)
Immature Granulocytes: 1 %
Lymphocytes Relative: 12 %
Lymphs Abs: 1 K/uL (ref 0.7–4.0)
MCH: 26.6 pg (ref 26.0–34.0)
MCHC: 31 g/dL (ref 30.0–36.0)
MCV: 85.7 fL (ref 80.0–100.0)
Monocytes Absolute: 0.7 K/uL (ref 0.1–1.0)
Monocytes Relative: 9 %
Neutro Abs: 6.3 K/uL (ref 1.7–7.7)
Neutrophils Relative %: 75 %
Platelets: 283 K/uL (ref 150–400)
RBC: 4.06 MIL/uL (ref 3.87–5.11)
RDW: 14.5 % (ref 11.5–15.5)
WBC: 8.4 K/uL (ref 4.0–10.5)
nRBC: 0 % (ref 0.0–0.2)

## 2023-11-17 LAB — COMPREHENSIVE METABOLIC PANEL WITH GFR
ALT: 13 U/L (ref 0–44)
AST: 15 U/L (ref 15–41)
Albumin: 3.1 g/dL — ABNORMAL LOW (ref 3.5–5.0)
Alkaline Phosphatase: 97 U/L (ref 38–126)
Anion gap: 9 (ref 5–15)
BUN: 21 mg/dL (ref 8–23)
CO2: 24 mmol/L (ref 22–32)
Calcium: 9.2 mg/dL (ref 8.9–10.3)
Chloride: 105 mmol/L (ref 98–111)
Creatinine, Ser: 0.67 mg/dL (ref 0.44–1.00)
GFR, Estimated: >60 mL/min (ref >60)
Glucose, Bld: 82 mg/dL (ref 70–99)
Potassium: 3.9 mmol/L (ref 3.5–5.1)
Sodium: 138 mmol/L (ref 135–145)
Total Bilirubin: 0.5 mg/dL (ref 0.0–1.2)
Total Protein: 6.6 g/dL (ref 6.5–8.1)

## 2023-11-17 LAB — RESP PANEL BY RT-PCR (RSV, FLU A&B, COVID)  RVPGX2
Influenza A by PCR: NEGATIVE
Influenza B by PCR: NEGATIVE
Resp Syncytial Virus by PCR: NEGATIVE
SARS Coronavirus 2 by RT PCR: NEGATIVE

## 2023-11-17 LAB — BRAIN NATRIURETIC PEPTIDE: B Natriuretic Peptide: 99 pg/mL (ref 0.0–100.0)

## 2023-11-17 LAB — TROPONIN I (HIGH SENSITIVITY)
Troponin I (High Sensitivity): 11 ng/L (ref <18)
Troponin I (High Sensitivity): 14 ng/L (ref <18)

## 2023-11-17 LAB — PROCALCITONIN: Procalcitonin: <0.1 ng/mL

## 2023-11-17 LAB — D-DIMER, QUANTITATIVE: D-Dimer, Quant: 0.7 ug{FEU}/mL — ABNORMAL HIGH (ref 0.00–0.50)

## 2023-11-17 MED ORDER — LISINOPRIL-HYDROCHLOROTHIAZIDE 20-12.5 MG PO TABS: 1.0000 | ORAL_TABLET | Freq: Every day | ORAL | Status: DC

## 2023-11-17 MED ORDER — DULOXETINE HCL 60 MG PO CPEP
90.0000 mg | ORAL_CAPSULE | Freq: Every day | ORAL | Status: DC
  Administered 2023-11-18: 90 mg via ORAL
  Filled 2023-11-17: qty 1

## 2023-11-17 MED ORDER — ACETAMINOPHEN 500 MG PO TABS
1000.0000 mg | ORAL_TABLET | Freq: Once | ORAL | Status: AC
  Administered 2023-11-17: 1000 mg via ORAL
  Filled 2023-11-17: qty 2

## 2023-11-17 MED ORDER — HYDRALAZINE HCL 20 MG/ML IJ SOLN: 5.0000 mg | INTRAMUSCULAR | Status: DC | PRN

## 2023-11-17 MED ORDER — ENOXAPARIN SODIUM 40 MG/0.4ML IJ SOSY
40.0000 mg | PREFILLED_SYRINGE | INTRAMUSCULAR | Status: DC
  Administered 2023-11-18: 40 mg via SUBCUTANEOUS
  Filled 2023-11-17: qty 0.4

## 2023-11-17 MED ORDER — SODIUM CHLORIDE 0.9 % IV SOLN: 2.0000 g | Freq: Once | INTRAVENOUS | Status: DC

## 2023-11-17 MED ORDER — AZITHROMYCIN 250 MG PO TABS
500.0000 mg | ORAL_TABLET | Freq: Once | ORAL | Status: DC
Start: 2023-11-18 — End: 2023-11-18

## 2023-11-17 MED ORDER — SODIUM CHLORIDE 0.9 % IV SOLN
1.0000 g | Freq: Once | INTRAVENOUS | Status: AC
  Administered 2023-11-17: 1 g via INTRAVENOUS
  Filled 2023-11-17: qty 10

## 2023-11-17 MED ORDER — HYDROCHLOROTHIAZIDE 12.5 MG PO TABS
12.5000 mg | ORAL_TABLET | Freq: Every day | ORAL | Status: DC
  Administered 2023-11-18: 12.5 mg via ORAL
  Filled 2023-11-17: qty 1

## 2023-11-17 MED ORDER — LISINOPRIL 10 MG PO TABS
20.0000 mg | ORAL_TABLET | Freq: Every day | ORAL | Status: DC
  Administered 2023-11-18: 20 mg via ORAL
  Filled 2023-11-17: qty 2

## 2023-11-17 MED ORDER — IOHEXOL 350 MG/ML SOLN
80.0000 mL | Freq: Once | INTRAVENOUS | Status: AC | PRN
  Administered 2023-11-17: 75 mL via INTRAVENOUS

## 2023-11-17 MED ORDER — ALBUTEROL SULFATE (2.5 MG/3ML) 0.083% IN NEBU: 2.5000 mg | INHALATION_SOLUTION | RESPIRATORY_TRACT | Status: DC | PRN

## 2023-11-17 MED ORDER — AZITHROMYCIN 250 MG PO TABS
500.0000 mg | ORAL_TABLET | Freq: Once | ORAL | Status: AC
  Administered 2023-11-17: 500 mg via ORAL
  Filled 2023-11-17: qty 2

## 2023-11-17 MED ORDER — PANTOPRAZOLE SODIUM 40 MG PO TBEC
40.0000 mg | DELAYED_RELEASE_TABLET | Freq: Every day | ORAL | Status: DC
Start: 2023-11-18 — End: 2023-11-18
  Administered 2023-11-18: 40 mg via ORAL
  Filled 2023-11-17: qty 1

## 2023-11-17 MED ORDER — METHYLPREDNISOLONE SODIUM SUCC 125 MG IJ SOLR
80.0000 mg | Freq: Every day | INTRAMUSCULAR | Status: DC
  Administered 2023-11-17 – 2023-11-18 (×2): 80 mg via INTRAVENOUS
  Filled 2023-11-17 (×2): qty 2

## 2023-11-17 MED ORDER — HYDROCODONE-ACETAMINOPHEN 5-325 MG PO TABS: 1.0000 | ORAL_TABLET | Freq: Four times a day (QID) | ORAL | Status: DC | PRN

## 2023-11-17 MED ORDER — IPRATROPIUM-ALBUTEROL 0.5-2.5 (3) MG/3ML IN SOLN
3.0000 mL | RESPIRATORY_TRACT | Status: DC
  Administered 2023-11-17: 3 mL via RESPIRATORY_TRACT
  Filled 2023-11-17: qty 3

## 2023-11-17 MED ORDER — IPRATROPIUM-ALBUTEROL 0.5-2.5 (3) MG/3ML IN SOLN
3.0000 mL | Freq: Three times a day (TID) | RESPIRATORY_TRACT | Status: DC
  Administered 2023-11-18: 3 mL via RESPIRATORY_TRACT
  Filled 2023-11-17: qty 3

## 2023-11-17 NOTE — H&P (Signed)
 [facility] H&P   Patient Demographics:    Zareah Hunzeker, is a 68 y.o. female  MRN: [id number]   DOB - 1955-05-28  Admit Date - 11/17/2023  Outpatient Primary MD for the patient is Trudy Vaughn FALCON, MD  Referring MD/NP/PA: PA Celeste  Outpatient Specialists: Pulmonary Dr. Shelah  Patient coming from: PCP office  Chief Complaint  Patient presents with   Chest Pain      HPI:    Shatavia Santor  is a 68 y.o. female, with past medical history of hypertension, GERD, former tobacco use, quit 10 years ago, stable pulmonary nodule followed by pulmonary Dr. Shelah, patient presents to ED secondary to complaints of dyspnea, she has been having progressive dyspnea over the last 2 weeks, denies fever, chills, reports cough which provoking chest pain as well, pleuritic, seen by urgent care yesterday where she was prescribed azithromycin  and prednisone but she did not take yet as she saw her PCP today in the office he was concerned about her EKG so sent her to ED for further evaluation. - in ED x-ray with no significant concerns, troponins negative x 2, CTA chest negative for PE but concerning for groundglass opacity concerning for atypical pneumonia, versus interstitial lung disease, versus volume overload, but BNP within normal limit, she was hypoxic 87% on room air, started on oxygen and [facility] hospitalist consulted to admit.    Review of systems:      A full 10 point Review of Systems was done, except as stated above, all other Review of Systems were negative.   With Past History of the following :    Past Medical History:  Diagnosis Date   Anxiety    Degeneration of lumbar intervertebral disc    Degenerative scoliosis    Depression    GERD (gastroesophageal reflux disease)    Hypertension    Low back pain    Lumbar radiculopathy    Lumbar spondylosis    Neurogenic claudication    Pain  in joint of right shoulder    Pain of left hip joint    Rotator cuff arthropathy of right shoulder    Spinal stenosis       Past Surgical History:  Procedure Laterality Date   ABDOMINAL EXPOSURE N/A 09/12/2019   Procedure: ABDOMINAL EXPOSURE;  Surgeon: Serene Gaile ORN, MD;  Location: [facility];  Service: Vascular;  Laterality: N/A;   ANTERIOR LUMBAR FUSION N/A 09/12/2019   Procedure: ANTERIOR LUMBAR FUSION LUMBAR FIVE-SACRAL ONE  ANTERIOR LATERAL LUMBAR FUSION LUMBAR THREE-LUMBAR FIVE;  Surgeon: Burnetta Aures, MD;  Location: [facility] OR;  Service: Orthopedics;  Laterality: N/A;  5 hrs Left sided tap block with exparel  Dr. Serene to do approach   BACK SURGERY  02/2019   BIOPSY  04/08/2021   Procedure: BIOPSY;  Surgeon: Eartha Angelia Sieving, MD;  Location: [facility];  Service: Gastroenterology;;   CARPAL TUNNEL RELEASE  2017   left hand   COLONOSCOPY WITH PROPOFOL  N/A 04/08/2021   Procedure: COLONOSCOPY WITH PROPOFOL ;  Surgeon: Eartha Angelia Sieving, MD;  Location: [facility];  Service: Gastroenterology;  Laterality: N/A;  12:00   ESOPHAGOGASTRODUODENOSCOPY (EGD) WITH PROPOFOL  N/A 04/08/2021   Procedure: ESOPHAGOGASTRODUODENOSCOPY (EGD) WITH PROPOFOL ;  Surgeon: Eartha Angelia Sieving, MD;  Location: [facility];  Service: Gastroenterology;  Laterality: N/A;   HOT HEMOSTASIS  04/08/2021   Procedure: HOT HEMOSTASIS (ARGON PLASMA COAGULATION/BICAP);  Surgeon: Eartha Angelia, Sieving, MD;  Location: [facility];  Service: Gastroenterology;;   POSTERIOR LUMBAR FUSION 4 LEVEL N/A 09/13/2019   Procedure: POSTERIOR LUMBAR FUSION INTERBODY LUMBAR THREE - SACRAL ONE, LUMBAR THREE KYPHOPLASTY;  Surgeon: Burnetta Aures, MD;  Location: [facility];  Service: Orthopedics;  Laterality: N/A;  4 hrs   REVERSE SHOULDER ARTHROPLASTY Right 05/24/2019   Procedure: REVERSE SHOULDER ARTHROPLASTY SDDC;  Surgeon: Melita Drivers, MD;  Location: [facility];  Service: Orthopedics;  Laterality: Right;     SHOULDER SURGERY     left shoulder   TUBAL LIGATION        Social History:     Social History   Tobacco Use   Smoking status: Former    Current packs/day: 0.00    Average packs/day: 1 pack/day for 35.0 years (35.0 ttl pk-yrs)    Types: Cigarettes    Start date: 06/12/1978    Quit date: 06/12/2013    Years since quitting: 10.4   Smokeless tobacco: Never  Substance Use Topics   Alcohol use: Yes    Comment: occassionally        Family History :     Family History  Problem Relation Age of Onset   Cancer Father    Bladder Cancer Brother    Healthy Son    Healthy Daughter       Home Medications:   Prior to Admission medications   Medication Sig Start Date End Date Taking? Authorizing Provider  albuterol  (VENTOLIN  HFA) 108 (90 Base) MCG/ACT inhaler Inhale 1-2 puffs into the lungs every 4 (four) hours as needed for wheezing or shortness of breath. 11/16/23  Yes [provider]  Aspirin-Acetaminophen  (GOODYS BODY PAIN PO) Take 1 Package by mouth daily as needed (pain).   Yes [provider]  doxycycline (MONODOX) 50 MG capsule Take 50 mg by mouth daily.   Yes [provider]  DULoxetine  (CYMBALTA ) 30 MG capsule Take 90 mg by mouth daily.   Yes [provider]  HYDROcodone -acetaminophen  (NORCO/VICODIN) 5-325 MG tablet Take 1 tablet by mouth every 6 (six) hours as needed. Patient taking differently: Take 1 tablet by mouth every 6 (six) hours as needed for moderate pain (pain score 4-6). 06/03/22  Yes Schutt, Marsa HERO, PA-C  lisinopril -hydrochlorothiazide  (ZESTORETIC ) 20-12.5 MG tablet Take 1 tablet by mouth daily.  04/19/19  Yes [provider]  pantoprazole  (PROTONIX ) 40 MG tablet Take 40 mg by mouth daily. 12/04/20  Yes [provider]  predniSONE (DELTASONE) 20 MG tablet Take 40 mg by mouth daily with breakfast.   Yes [provider]  tretinoin (RETIN-A) 0.025 % cream Apply 1 application topically at bedtime.   Yes  [provider]     Allergies:    No Known Allergies   Physical Exam:   Vitals  Blood pressure (!) 160/95, pulse 78, temperature 97.8 F (36.6 C), temperature source Oral, resp. rate 20, height 5' 3 (1.6 m), weight 72.3 kg, SpO2 95%.   1. General Well Developed  female, no apparent distress  2. Normal affect and insight, Not Suicidal or Homicidal, Awake Alert, Oriented X 3.  3. No F.N deficits, ALL C.Nerves Intact, Strength 5/5 all 4 extremities, Sensation intact all 4 extremities, Plantars down going.  4. Ears and Eyes appear Normal, Conjunctivae clear, PERRLA. Moist Oral Mucosa.  5. Supple Neck, No JVD, No cervical lymphadenopathy appriciated, No Carotid Bruits.  6. Symmetrical Chest wall movement, Good air movement bilaterally, CTAB.  7. RRR, No Gallops, Rubs or Murmurs, No Parasternal Heave.  8. Positive Bowel Sounds, Abdomen Soft, No tenderness, No organomegaly appriciated,No rebound -guarding or rigidity.  9.  No Cyanosis, Normal Skin Turgor, No Skin Rash or Bruise.  10. Good muscle tone,  joints appear normal , no effusions, Normal ROM.   Data Review:    CBC Recent Labs  Lab 11/17/23 1411  WBC 8.4  HGB 10.8*  HCT 34.8*  PLT 283  MCV 85.7  MCH 26.6  MCHC 31.0  RDW 14.5  LYMPHSABS 1.0  MONOABS 0.7  EOSABS 0.2  BASOSABS 0.0   ------------------------------------------------------------------------------------------------------------------  Chemistries  Recent Labs  Lab 11/17/23 1411  NA 138  K 3.9  CL 105  CO2 24  GLUCOSE 82  BUN 21  CREATININE 0.67  CALCIUM 9.2  AST 15  ALT 13  ALKPHOS 97  BILITOT 0.5   ------------------------------------------------------------------------------------------------------------------ estimated creatinine clearance is 63.3 mL/min (by C-G formula based on SCr of 0.67 mg/dL). ------------------------------------------------------------------------------------------------------------------ No  results for input(s): TSH, T4TOTAL, T3FREE, THYROIDAB in the last 72 hours.  Invalid input(s): FREET3  Coagulation profile No results for input(s): INR, PROTIME in the last 168 hours. ------------------------------------------------------------------------------------------------------------------- Recent Labs    11/17/23 1411  DDIMER 0.70*   -------------------------------------------------------------------------------------------------------------------  Cardiac Enzymes No results for input(s): CKMB, TROPONINI, MYOGLOBIN in the last 168 hours.  Invalid input(s): CK ------------------------------------------------------------------------------------------------------------------    Component Value Date/Time   BNP 99.0 11/17/2023 1411     ---------------------------------------------------------------------------------------------------------------  Urinalysis    Component Value Date/Time   COLORURINE YELLOW 09/10/2019 1037   APPEARANCEUR CLEAR 09/10/2019 1037   LABSPEC 1.019 09/10/2019 1037   PHURINE 5.0 09/10/2019 1037   GLUCOSEU NEGATIVE 09/10/2019 1037   HGBUR NEGATIVE 09/10/2019 1037   BILIRUBINUR NEGATIVE 09/10/2019 1037   KETONESUR NEGATIVE 09/10/2019 1037   PROTEINUR NEGATIVE 09/10/2019 1037   NITRITE NEGATIVE 09/10/2019 1037   LEUKOCYTESUR SMALL (A) 09/10/2019 1037    ----------------------------------------------------------------------------------------------------------------   Imaging Results:    CT Angio Chest PE W and/or Wo Contrast Result Date: 11/17/2023 CLINICAL DATA:  PE suspected, worsening chest pain for 2 weeks EXAM: CT ANGIOGRAPHY CHEST WITH CONTRAST TECHNIQUE: Multidetector CT imaging of the chest was performed using the standard protocol during bolus administration of intravenous contrast. Multiplanar CT image reconstructions and MIPs were obtained to evaluate the vascular anatomy. RADIATION DOSE REDUCTION: This exam was  performed according to the departmental dose-optimization program which includes automated exposure control, adjustment of the mA and/or kV according to patient size and/or use of iterative reconstruction technique. CONTRAST:  75mL OMNIPAQUE  IOHEXOL  350 MG/ML SOLN COMPARISON:  11/13/2021 FINDINGS: Cardiovascular: Satisfactory opacification of the pulmonary arteries to the segmental level. No evidence of pulmonary embolism. Mild cardiomegaly. Left coronary artery calcifications. No pericardial effusion. Mediastinum/Nodes: No enlarged mediastinal, hilar, or axillary lymph nodes. Thyroid gland, trachea, and esophagus demonstrate no significant findings. Lungs/Pleura: Diffuse, irregular ground-glass airspace opacity scattered throughout the lungs, generally most concentrated in the upper lobes. Mild underlying interlobular septal thickening throughout the lungs. No pleural effusion or pneumothorax. Upper Abdomen: No  acute abnormality. Musculoskeletal: No chest wall abnormality. No acute osseous findings. Status post right shoulder reverse arthroplasty. Review of the MIP images confirms the above findings. IMPRESSION: 1. Negative examination for pulmonary embolism. 2. Diffuse, irregular ground-glass airspace opacity scattered throughout the lungs, generally most concentrated in the upper lobes. Mild underlying interlobular septal thickening throughout the lungs. Findings are consistent with edema or atypical/viral infection. 3. Mild cardiomegaly.  Coronary artery disease Electronically Signed   By: Marolyn JONETTA Jaksch M.D.   On: 11/17/2023 16:25   DG Chest 2 View Result Date: 11/17/2023 CLINICAL DATA:  Chest pain. EXAM: CHEST - 2 VIEW COMPARISON:  11/17/2023. FINDINGS: There are moderate diffuse increased interstitial markings, grossly similar to the prior study. Findings are nonspecific and differential diagnosis includes pulmonary edema, underlying chronic interstitial lung disease, emphysema, atypical pneumonia, etc.  Bilateral lung fields are otherwise clear. No acute consolidation or lung collapse. Bilateral costophrenic angles are clear. Stable cardio-mediastinal silhouette. No acute osseous abnormalities. Right reverse shoulder arthroplasty noted. The soft tissues are within normal limits. IMPRESSION: Moderate diffuse increased interstitial markings, grossly similar to the prior study. Findings are nonspecific and differential diagnosis includes pulmonary edema, underlying chronic interstitial lung disease, emphysema, atypical pneumonia, etc. Electronically Signed   By: Ree Molt M.D.   On: 11/17/2023 14:23    EKG:  Vent. rate 76 BPM PR interval 172 ms QRS duration 92 ms QT/QTcB 370/416 ms P-R-T axes 76 13 71 Sinus rhythm with Premature atrial complexes Cannot rule out Anterior infarct (cited on or before 22-May-2019) Abnormal ECG When compared with ECG of 03-Apr-2021 10:28, Premature atrial complexes are now Present Criteria for Inferior infarct are no longer Present  Assessment & Plan:    Principal Problem:   Pneumonia Active Problems:   Essential hypertension with goal blood pressure less than 140/90   Gastroesophageal reflux disease without esophagitis   Pulmonary nodule   Acute respiratory failure with hypoxia Pneumonia -With progressive dyspnea, cough, CTA chest significant for groundglass opacity, concerning for atypical pneumonia, versus interstitial lung disease.  vs volume overload), less likely as no evidence of volume overload on physical exam and BNP within normal limit) - Followed by Dr. Shelah as an outpatient, she had normal pulmonary function test 3 years ago. - Will be admitted up pneumonia pathway, will obtain respiratory panel as well given some atypical/viral pneumonia concern on imaging. - Continue with Rocephin  and azithromycin . - Encouraged with incentive spirometry and flutter valve. - Will keep on scheduled DuoNebs and as needed albuterol . - Discussed with the  patient, she will need to follow with Dr. Shelah as an outpatient regarding further workup if needed(given concern for interstitial lung disease) - Saturating 87% on room air, currently 95% on room air, wean as tolerated  Chest pain -Not typical, most likely related to her pneumonia  - CTA chest negative for PE - EKG nonacute - Troponins negative x 2 - Will obtain 2D echo  Hypertension - Continue with home medications  Pulmonary nodule - Followed by Dr. Shelah, he has been getting yearly CT chest over last 3 years     DVT Prophylaxis  Lovenox   AM Labs Ordered, also please review Full Orders  Family Communication: Admission, patients condition and plan of care including tests being ordered have been discussed with the patient  who indicate understanding and agree with the plan and Code Status.  Code Status full code  Likely DC to home  Consults called: None  Admission status: Observation  Time spent in minutes :  70 minutes   Brayton Lye M.D on 11/17/2023 at 6:18 PM   [facility] - Office  [phone]

## 2023-11-17 NOTE — ED Notes (Signed)
Dr. Miller at bedside assessing patient.

## 2023-11-17 NOTE — ED Triage Notes (Signed)
 Pt arrived via pOV c/o sternal heavy chest pain that has been progressively worsening over a week or two. Pt reports coming to [facility] for further evaluation after her PCP appointment and her PCP voiced concern about an abnormal EKG. Pt unsure what the doctor saw on her EKG to warrant her visit to the ED. Pt reports she does have heavy chest pain when she takes a breath.

## 2023-11-17 NOTE — ED Provider Notes (Signed)
 [place] EMERGENCY DEPARTMENT AT [facility] Provider Note   CSN: [id number] Arrival date & time: 11/17/23  1321     Patient presents with: Chest Pain   GISSELA BLOCH is a 68 y.o. female.  She has history of anemia, anxiety, neurogenic claudication.  Presents to ER for evaluation of chest pain today, is admitted this chest pain that is worse with taking a deep breath and coughing.  States she only coughs when she takes a very deep breath.  It has been going on for the past approximately 2 weeks and has been getting worse.  She has been to the ER for further evaluation by her PCP.  She denies lower extremity swelling or pain, exertional pain, hemoptysis, fever or chills.  Pain is not changing with position, it does not seem to be worse after eating.  Is not associated with nausea or sweating or other symptoms.  She denies history of cancer, recent surgery or immobilization, recent travel.    Chest Pain      Prior to Admission medications   Medication Sig Start Date End Date Taking? Authorizing Provider  albuterol  (VENTOLIN  HFA) 108 (90 Base) MCG/ACT inhaler Inhale 1-2 puffs into the lungs every 4 (four) hours as needed for wheezing or shortness of breath. 11/16/23  Yes [provider]  Aspirin-Acetaminophen  (GOODYS BODY PAIN PO) Take 1 Package by mouth daily as needed (pain).   Yes [provider]  doxycycline (MONODOX) 50 MG capsule Take 50 mg by mouth daily.   Yes [provider]  DULoxetine  (CYMBALTA ) 30 MG capsule Take 90 mg by mouth daily.   Yes [provider]  HYDROcodone -acetaminophen  (NORCO/VICODIN) 5-325 MG tablet Take 1 tablet by mouth every 6 (six) hours as needed. Patient taking differently: Take 1 tablet by mouth every 6 (six) hours as needed for moderate pain (pain score 4-6). 06/03/22  Yes Schutt, Marsa HERO, PA-C  lisinopril -hydrochlorothiazide  (ZESTORETIC ) 20-12.5 MG tablet Take 1 tablet by mouth daily.  04/19/19  Yes [provider]  pantoprazole  (PROTONIX ) 40 MG tablet Take 40 mg by mouth daily. 12/04/20  Yes [provider]  predniSONE (DELTASONE) 20 MG tablet Take 40 mg by mouth daily with breakfast.   Yes [provider]  tretinoin (RETIN-A) 0.025 % cream Apply 1 application topically at bedtime.   Yes [provider]    Allergies: Patient has no known allergies.    Review of Systems  Cardiovascular:  Positive for chest pain.    Updated Vital Signs BP (!) 144/82 (BP Location: Right Arm)   Pulse 80   Temp 97.9 F (36.6 C) (Oral)   Resp (!) 22   Ht 5' 3 (1.6 m)   Wt 72.3 kg   SpO2 91%   BMI 28.24 kg/m   Physical Exam Vitals and nursing note reviewed.  Constitutional:      General: She is not in acute distress.    Appearance: She is well-developed.  HENT:     Head: Normocephalic and atraumatic.  Eyes:     Extraocular Movements: Extraocular movements intact.     Conjunctiva/sclera: Conjunctivae normal.     Pupils: Pupils are equal, round, and reactive to light.  Cardiovascular:     Rate and Rhythm: Normal rate and regular rhythm.     Heart sounds: No murmur heard. Pulmonary:     Effort: Pulmonary effort is normal. No respiratory distress.     Breath sounds: Examination of the right-upper field reveals rhonchi. Examination of the  left-upper field reveals rhonchi. Examination of the right-middle field reveals rhonchi. Examination of the left-middle field reveals rhonchi. Examination of the right-lower field reveals decreased breath sounds. Examination of the left-lower field reveals decreased breath sounds. Decreased breath sounds and rhonchi present.  Abdominal:     Palpations: Abdomen is soft.     Tenderness: There is no abdominal tenderness.  Musculoskeletal:        General: No swelling.     Cervical back: Neck supple.  Skin:    General: Skin is warm and dry.     Capillary Refill: Capillary refill takes less than 2 seconds.  Neurological:     Mental  Status: She is alert.  Psychiatric:        Mood and Affect: Mood normal.     (all labs ordered are listed, but only abnormal results are displayed) Labs Reviewed  CBC WITH DIFFERENTIAL/PLATELET - Abnormal; Notable for the following components:      Result Value   Hemoglobin 10.8 (*)    HCT 34.8 (*)    All other components within normal limits  D-DIMER, QUANTITATIVE - Abnormal; Notable for the following components:   D-Dimer, Quant 0.70 (*)    All other components within normal limits  COMPREHENSIVE METABOLIC PANEL WITH GFR  BRAIN NATRIURETIC PEPTIDE  TROPONIN I (HIGH SENSITIVITY)  TROPONIN I (HIGH SENSITIVITY)    EKG: EKG Interpretation Date/Time:  Thursday November 17 2023 13:45:29 EDT Ventricular Rate:  76 PR Interval:  172 QRS Duration:  92 QT Interval:  370 QTC Calculation: 416 R Axis:   13  Text Interpretation: Sinus rhythm with Premature atrial complexes Cannot rule out Anterior infarct (cited on or before 22-May-2019) Abnormal ECG When compared with ECG of 03-Apr-2021 10:28, Premature atrial complexes are now Present Criteria for Inferior infarct are no longer Present Confirmed by Cleotilde Rogue ([id number]) on 11/17/2023 2:25:35 PM  Radiology: DG Chest 2 View Result Date: 11/17/2023 CLINICAL DATA:  Chest pain. EXAM: CHEST - 2 VIEW COMPARISON:  11/17/2023. FINDINGS: There are moderate diffuse increased interstitial markings, grossly similar to the prior study. Findings are nonspecific and differential diagnosis includes pulmonary edema, underlying chronic interstitial lung disease, emphysema, atypical pneumonia, etc. Bilateral lung fields are otherwise clear. No acute consolidation or lung collapse. Bilateral costophrenic angles are clear. Stable cardio-mediastinal silhouette. No acute osseous abnormalities. Right reverse shoulder arthroplasty noted. The soft tissues are within normal limits. IMPRESSION: Moderate diffuse increased interstitial markings, grossly similar to the prior  study. Findings are nonspecific and differential diagnosis includes pulmonary edema, underlying chronic interstitial lung disease, emphysema, atypical pneumonia, etc. Electronically Signed   By: Ree Molt M.D.   On: 11/17/2023 14:23     Procedures   Medications Ordered in the ED - No data to display                                  Medical Decision Making Diagnosis includes but limited to pneumonia, ACS, PE, pneumothorax, pericarditis, enteritis, other  Course: Patient is here for chest pain that has been ongoing for the past couple weeks, she feels somewhat short of breath with it, it hurts when she takes a very deep breath, and she coughs when she takes deep breath.  Some nonproductive cough, no hemoptysis, no PE risk factors or signs of VTE. D-dimer positive CTA ordered no PE but does have bilateral interstitial changes concerning for possible pneumonia versus edema, patient has no history of  heart failure, normal BNP, no signs of volume overload so likely atypical pneumonia, will discuss hospitalist for admission as she did have oxygen desaturation while here to 87% on room air.  She is not on home oxygen.  She is a former smoker.  Consults: I spoke with Dr. Sherlon reality about patient's findings and he is agreeable with admission at this time.  Ceftriaxone  and azithromycin  ordered for likely pneumonia  Amount and/or Complexity of Data Reviewed Labs: ordered. Radiology: ordered.  Risk OTC drugs. Prescription drug management. Decision regarding hospitalization.        Final diagnoses:  None    ED Discharge Orders     None          Suellen Sherran DELENA DEVONNA 11/17/23 2343    Cleotilde Rogue, MD 11/18/23 1736

## 2023-11-17 NOTE — ED Notes (Signed)
 PA at bedside.

## 2023-11-17 NOTE — ED Notes (Signed)
 Hospitalist at bedside assessing patient and updating on plan of care.

## 2023-11-18 ENCOUNTER — Other Ambulatory Visit (HOSPITAL_COMMUNITY)

## 2023-11-18 DIAGNOSIS — J189 Pneumonia, unspecified organism: Secondary | ICD-10-CM | POA: Diagnosis not present

## 2023-11-18 LAB — BASIC METABOLIC PANEL WITH GFR
Anion gap: 9 (ref 5–15)
BUN: 20 mg/dL (ref 8–23)
CO2: 24 mmol/L (ref 22–32)
Calcium: 8.6 mg/dL — ABNORMAL LOW (ref 8.9–10.3)
Chloride: 103 mmol/L (ref 98–111)
Creatinine, Ser: 0.63 mg/dL (ref 0.44–1.00)
GFR, Estimated: >60 mL/min (ref >60)
Glucose, Bld: 177 mg/dL — ABNORMAL HIGH (ref 70–99)
Potassium: 5.1 mmol/L (ref 3.5–5.1)
Sodium: 136 mmol/L (ref 135–145)

## 2023-11-18 LAB — STREP PNEUMONIAE URINARY ANTIGEN: Strep Pneumo Urinary Antigen: NEGATIVE

## 2023-11-18 LAB — CBC
HCT: 35.5 % — ABNORMAL LOW (ref 36.0–46.0)
Hemoglobin: 10.8 g/dL — ABNORMAL LOW (ref 12.0–15.0)
MCH: 26.5 pg (ref 26.0–34.0)
MCHC: 30.4 g/dL (ref 30.0–36.0)
MCV: 87.2 fL (ref 80.0–100.0)
Platelets: 263 K/uL (ref 150–400)
RBC: 4.07 MIL/uL (ref 3.87–5.11)
RDW: 14.4 % (ref 11.5–15.5)
WBC: 4.7 K/uL (ref 4.0–10.5)
nRBC: 0 % (ref 0.0–0.2)

## 2023-11-18 LAB — HIV ANTIBODY (ROUTINE TESTING W REFLEX): HIV Screen 4th Generation wRfx: NONREACTIVE

## 2023-11-18 MED ORDER — PREDNISONE 20 MG PO TABS
40.0000 mg | ORAL_TABLET | Freq: Every day | ORAL | 0 refills | Status: AC
Start: 2023-11-18 — End: 2023-11-22

## 2023-11-18 MED ORDER — CEFDINIR 300 MG PO CAPS
300.0000 mg | ORAL_CAPSULE | Freq: Two times a day (BID) | ORAL | 0 refills | Status: AC
Start: 2023-11-18 — End: 2023-11-22

## 2023-11-18 MED ORDER — AZITHROMYCIN 250 MG PO TABS: 250.0000 mg | ORAL_TABLET | Freq: Every day | ORAL | 0 refills | Status: AC

## 2023-11-18 NOTE — Discharge Summary (Signed)
 Physician Discharge Summary   Patient: Terri Alvarado MRN: [id number] DOB: Nov 03, 1955  Admit date:     11/17/2023  Discharge date: 11/18/23  Discharge Physician: Bernardino KATHEE Come   PCP: Trudy Vaughn FALCON, MD   Recommendations at discharge:  Follow up with pulmonology in 2-4 weeks. CT raised concern for ILD, defer further work up to Dr. Shelah.   Discharge Diagnoses: Principal Problem:   Pneumonia Active Problems:   Essential hypertension with goal blood pressure less than 140/90   Gastroesophageal reflux disease without esophagitis   Pulmonary nodule  Hospital Course: Terri Alvarado  is a 68 y.o. female, with past medical history of hypertension, GERD, former tobacco use, quit 10 years ago, stable pulmonary nodule followed by pulmonary Dr. Shelah, patient presents to ED secondary to complaints of dyspnea, she has been having progressive dyspnea over the last 2 weeks, denies fever, chills, reports cough which provoking chest pain as well, pleuritic, seen by urgent care yesterday where she was prescribed azithromycin  and prednisone but she did not take yet as she saw her PCP today in the office he was concerned about her EKG so sent her to ED for further evaluation. - in ED x-ray with no significant concerns, troponins negative x 2, CTA chest negative for PE but concerning for groundglass opacity concerning for atypical pneumonia, versus interstitial lung disease, versus volume overload, but BNP within normal limit, she was hypoxic 87% on room air, started on oxygen and [facility] consulted to admit.  Assessment and Plan: Acute respiratory failure with hypoxia due to atypical pneumonia: Progressive dyspnea, cough, CTA chest significant for groundglass opacity, concerning for atypical pneumonia, versus interstitial lung disease.  vs volume overload), less likely as no evidence of volume overload on physical exam and BNP within normal limit) - Followed by Dr. Shelah as an outpatient, she had  normal pulmonary function test 3 years ago. Pt aware of recommendation to follow with Dr. Shelah as an outpatient regarding further workup if needed (given concern for interstitial lung disease) - Ambulated on the floor this morning with steady gait, no dyspnea, and SpO2 94-97%.  - Pneumococcal and legionella urine Ag and full respiratory viral panel are pending at discharge. Covid, RSV, and flu panel negative.  PCT negative. - Will treat for 5 total days with omnicef, azithromycin  and prednisone.    Chest pain: Pleuritic, none with exertion on day of discharge. ECG nonacute, troponin negative x2. No recent or current signs/symptoms of CHF. - Consider outpatient work up if pain returns.    Hypertension - Continue with home medications   Pulmonary nodule - Followed by Dr. Shelah, he has been getting yearly CT chest over last 3 years   Consultants: None Procedures performed: None  Disposition: Home Diet recommendation: Regular DISCHARGE MEDICATION: Allergies as of 11/18/2023   No Known Allergies      Medication List     TAKE these medications    albuterol  108 (90 Base) MCG/ACT inhaler Commonly known as: VENTOLIN  HFA Inhale 1-2 puffs into the lungs every 4 (four) hours as needed for wheezing or shortness of breath.   azithromycin  250 MG tablet Commonly known as: ZITHROMAX  Take 1 tablet (250 mg total) by mouth daily for 4 days.   cefdinir 300 MG capsule Commonly known as: OMNICEF Take 1 capsule (300 mg total) by mouth 2 (two) times daily for 4 days.   doxycycline 50 MG capsule Commonly known as: MONODOX Take 50 mg by mouth daily.   DULoxetine  30 MG capsule  Commonly known as: CYMBALTA  Take 90 mg by mouth daily.   GOODYS BODY PAIN PO Take 1 Package by mouth daily as needed (pain).   HYDROcodone -acetaminophen  5-325 MG tablet Commonly known as: NORCO/VICODIN Take 1 tablet by mouth every 6 (six) hours as needed. What changed: reasons to take this    lisinopril -hydrochlorothiazide  20-12.5 MG tablet Commonly known as: ZESTORETIC  Take 1 tablet by mouth daily.   pantoprazole  40 MG tablet Commonly known as: PROTONIX  Take 40 mg by mouth daily.   predniSONE 20 MG tablet Commonly known as: DELTASONE Take 2 tablets (40 mg total) by mouth daily with breakfast for 4 days.   tretinoin 0.025 % cream Commonly known as: RETIN-A Apply 1 application topically at bedtime.        Follow-up Information     Trudy Vaughn FALCON, MD Follow up.   Specialty: Internal Medicine Contact information: [address] [place] [address] [phone]         Shelah Lamar RAMAN, MD Follow up.   Specialty: Pulmonary Disease Contact information: [address] [place] [address] [phone]                Discharge Exam: Terri Alvarado   11/17/23 1346 11/17/23 1842  Weight: 72.3 kg 69.8 kg  BP 121/70   Pulse 72   Temp 97.9 F (36.6 C) (Oral)   Resp 18   Ht 5' 3 (1.6 m)   Wt 69.8 kg   SpO2 90%   BMI 27.26 kg/m   No distress, pleasant and lively Clear, nonlabored RRR, no MRG No edema or JVD.   Condition at discharge: stable  The results of significant diagnostics from this hospitalization (including imaging, microbiology, ancillary and laboratory) are listed below for reference.   Imaging Studies: CT Angio Chest PE W and/or Wo Contrast Result Date: 11/17/2023 CLINICAL DATA:  PE suspected, worsening chest pain for 2 weeks EXAM: CT ANGIOGRAPHY CHEST WITH CONTRAST TECHNIQUE: Multidetector CT imaging of the chest was performed using the standard protocol during bolus administration of intravenous contrast. Multiplanar CT image reconstructions and MIPs were obtained to evaluate the vascular anatomy. RADIATION DOSE REDUCTION: This exam was performed according to the departmental dose-optimization program which includes automated exposure control, adjustment of the mA and/or kV according to patient size and/or use of  iterative reconstruction technique. CONTRAST:  75mL OMNIPAQUE  IOHEXOL  350 MG/ML SOLN COMPARISON:  11/13/2021 FINDINGS: Cardiovascular: Satisfactory opacification of the pulmonary arteries to the segmental level. No evidence of pulmonary embolism. Mild cardiomegaly. Left coronary artery calcifications. No pericardial effusion. Mediastinum/Nodes: No enlarged mediastinal, hilar, or axillary lymph nodes. Thyroid gland, trachea, and esophagus demonstrate no significant findings. Lungs/Pleura: Diffuse, irregular ground-glass airspace opacity scattered throughout the lungs, generally most concentrated in the upper lobes. Mild underlying interlobular septal thickening throughout the lungs. No pleural effusion or pneumothorax. Upper Abdomen: No acute abnormality. Musculoskeletal: No chest wall abnormality. No acute osseous findings. Status post right shoulder reverse arthroplasty. Review of the MIP images confirms the above findings. IMPRESSION: 1. Negative examination for pulmonary embolism. 2. Diffuse, irregular ground-glass airspace opacity scattered throughout the lungs, generally most concentrated in the upper lobes. Mild underlying interlobular septal thickening throughout the lungs. Findings are consistent with edema or atypical/viral infection. 3. Mild cardiomegaly.  Coronary artery disease Electronically Signed   By: Marolyn JONETTA Jaksch M.D.   On: 11/17/2023 16:25   DG Chest 2 View Result Date: 11/17/2023 CLINICAL DATA:  Chest pain. EXAM: CHEST - 2 VIEW COMPARISON:  11/17/2023. FINDINGS:  There are moderate diffuse increased interstitial markings, grossly similar to the prior study. Findings are nonspecific and differential diagnosis includes pulmonary edema, underlying chronic interstitial lung disease, emphysema, atypical pneumonia, etc. Bilateral lung fields are otherwise clear. No acute consolidation or lung collapse. Bilateral costophrenic angles are clear. Stable cardio-mediastinal silhouette. No acute osseous  abnormalities. Right reverse shoulder arthroplasty noted. The soft tissues are within normal limits. IMPRESSION: Moderate diffuse increased interstitial markings, grossly similar to the prior study. Findings are nonspecific and differential diagnosis includes pulmonary edema, underlying chronic interstitial lung disease, emphysema, atypical pneumonia, etc. Electronically Signed   By: Ree Molt M.D.   On: 11/17/2023 14:23    Microbiology: Results for orders placed or performed during the hospital encounter of 11/17/23  Resp panel by RT-PCR (RSV, Flu A&B, Covid) Anterior Nasal Swab     Status: None   Collection Time: 11/17/23  6:45 PM   Specimen: Anterior Nasal Swab  Result Value Ref Range Status   SARS Coronavirus 2 by RT PCR NEGATIVE NEGATIVE Final    Comment: (NOTE) SARS-CoV-2 target nucleic acids are NOT DETECTED.  The SARS-CoV-2 RNA is generally detectable in upper respiratory specimens during the acute phase of infection. The lowest concentration of SARS-CoV-2 viral copies this assay can detect is 138 copies/mL. A negative result does not preclude SARS-Cov-2 infection and should not be used as the sole basis for treatment or other patient management decisions. A negative result may occur with  improper specimen collection/handling, submission of specimen other than nasopharyngeal swab, presence of viral mutation(s) within the areas targeted by this assay, and inadequate number of viral copies(<138 copies/mL). A negative result must be combined with clinical observations, patient history, and epidemiological information. The expected result is Negative.  Fact Sheet for Patients:  BloggerCourse.com  Fact Sheet for Healthcare Providers:  SeriousBroker.it  This test is no t yet approved or cleared by the [place]  FDA and  has been authorized for detection and/or diagnosis of SARS-CoV-2 by FDA under an Emergency Use  Authorization (EUA). This EUA will remain  in effect (meaning this test can be used) for the duration of the COVID-19 declaration under Section 564(b)(1) of the Act, 21 U.S.C.section 360bbb-3(b)(1), unless the authorization is terminated  or revoked sooner.       Influenza A by PCR NEGATIVE NEGATIVE Final   Influenza B by PCR NEGATIVE NEGATIVE Final    Comment: (NOTE) The Xpert Xpress SARS-CoV-2/FLU/RSV plus assay is intended as an aid in the diagnosis of influenza from Nasopharyngeal swab specimens and should not be used as a sole basis for treatment. Nasal washings and aspirates are unacceptable for Xpert Xpress SARS-CoV-2/FLU/RSV testing.  Fact Sheet for Patients: BloggerCourse.com  Fact Sheet for Healthcare Providers: SeriousBroker.it  This test is not yet approved or cleared by the [place]  FDA and has been authorized for detection and/or diagnosis of SARS-CoV-2 by FDA under an Emergency Use Authorization (EUA). This EUA will remain in effect (meaning this test can be used) for the duration of the COVID-19 declaration under Section 564(b)(1) of the Act, 21 U.S.C. section 360bbb-3(b)(1), unless the authorization is terminated or revoked.     Resp Syncytial Virus by PCR NEGATIVE NEGATIVE Final    Comment: (NOTE) Fact Sheet for Patients: BloggerCourse.com  Fact Sheet for Healthcare Providers: SeriousBroker.it  This test is not yet approved or cleared by the [place]  FDA and has been authorized for detection and/or diagnosis of SARS-CoV-2 by FDA under an Emergency Use Authorization (EUA). This EUA will  remain in effect (meaning this test can be used) for the duration of the COVID-19 declaration under Section 564(b)(1) of the Act, 21 U.S.C. section 360bbb-3(b)(1), unless the authorization is terminated or revoked.  Performed at [facility], [address],  [place], [place] [address]     Labs: CBC: Recent Labs  Lab 11/17/23 1411 11/18/23 0426  WBC 8.4 4.7  NEUTROABS 6.3  --   HGB 10.8* 10.8*  HCT 34.8* 35.5*  MCV 85.7 87.2  PLT 283 263   Basic Metabolic Panel: Recent Labs  Lab 11/17/23 1411 11/18/23 0426  NA 138 136  K 3.9 5.1  CL 105 103  CO2 24 24  GLUCOSE 82 177*  BUN 21 20  CREATININE 0.67 0.63  CALCIUM 9.2 8.6*   Liver Function Tests: Recent Labs  Lab 11/17/23 1411  AST 15  ALT 13  ALKPHOS 97  BILITOT 0.5  PROT 6.6  ALBUMIN  3.1*   CBG: No results for input(s): GLUCAP in the last 168 hours.  Discharge time spent: greater than 30 minutes.  Signed: Bernardino KATHEE Come, MD [facility] 11/18/2023

## 2023-11-18 NOTE — Progress Notes (Signed)
   11/18/23 1019  TOC Brief Assessment  Insurance and Status Reviewed  Patient has primary care physician Yes  Home environment has been reviewed Home w/spouse  Prior level of function: Independent  Prior/Current Home Services No current home services  Social Drivers of Health Review SDOH reviewed no interventions necessary  Readmission risk has been reviewed Yes  Transition of care needs no transition of care needs at this time

## 2023-11-19 LAB — LEGIONELLA PNEUMOPHILA SEROGP 1 UR AG: L. pneumophila Serogp 1 Ur Ag: NEGATIVE

## 2023-11-21 LAB — RESPIRATORY PANEL BY PCR

## 2024-02-15 ENCOUNTER — Encounter (INDEPENDENT_AMBULATORY_CARE_PROVIDER_SITE_OTHER): Payer: Self-pay | Admitting: Gastroenterology

## 2024-08-06 ENCOUNTER — Ambulatory Visit
